# Patient Record
Sex: Male | Born: 1975 | Race: Black or African American | Hispanic: No | Marital: Married | Smoking: Former smoker
Health system: Southern US, Community
[De-identification: ages and names within clinical notes are randomized; demographics above are authoritative.]

## PROBLEM LIST (undated history)

## (undated) DIAGNOSIS — I255 Ischemic cardiomyopathy: Secondary | ICD-10-CM

## (undated) DIAGNOSIS — F1410 Cocaine abuse, uncomplicated: Secondary | ICD-10-CM

## (undated) DIAGNOSIS — E119 Type 2 diabetes mellitus without complications: Secondary | ICD-10-CM

## (undated) DIAGNOSIS — F1290 Cannabis use, unspecified, uncomplicated: Secondary | ICD-10-CM

## (undated) DIAGNOSIS — I201 Angina pectoris with documented spasm: Secondary | ICD-10-CM

## (undated) DIAGNOSIS — I469 Cardiac arrest, cause unspecified: Secondary | ICD-10-CM

## (undated) DIAGNOSIS — I2510 Atherosclerotic heart disease of native coronary artery without angina pectoris: Secondary | ICD-10-CM

## (undated) DIAGNOSIS — Z720 Tobacco use: Secondary | ICD-10-CM

## (undated) HISTORY — DX: Ischemic cardiomyopathy: I25.5

## (undated) HISTORY — DX: Angina pectoris with documented spasm: I20.1

## (undated) HISTORY — DX: Cocaine abuse, uncomplicated: F14.10

## (undated) HISTORY — DX: Cannabis use, unspecified, uncomplicated: F12.90

## (undated) HISTORY — DX: Cardiac arrest, cause unspecified: I46.9

## (undated) HISTORY — DX: Atherosclerotic heart disease of native coronary artery without angina pectoris: I25.10

## (undated) HISTORY — DX: Tobacco use: Z72.0

---

## 2015-09-26 ENCOUNTER — Inpatient Hospital Stay (HOSPITAL_COMMUNITY): Payer: Self-pay

## 2015-09-26 ENCOUNTER — Ambulatory Visit (HOSPITAL_COMMUNITY): Admit: 2015-09-26 | Payer: Self-pay | Admitting: Cardiovascular Disease

## 2015-09-26 ENCOUNTER — Encounter (HOSPITAL_COMMUNITY): Payer: Self-pay | Admitting: Emergency Medicine

## 2015-09-26 ENCOUNTER — Encounter (HOSPITAL_COMMUNITY)
Admission: EM | Disposition: A | Discharge status: Home / Self Care | Payer: Self-pay | Source: Home / Self Care | Attending: Cardiovascular Disease

## 2015-09-26 ENCOUNTER — Inpatient Hospital Stay (HOSPITAL_COMMUNITY)
Admission: EM | Admit: 2015-09-26 | Discharge: 2015-09-29 | DRG: 246 | Disposition: A | Discharge status: Home / Self Care | Payer: Self-pay | Attending: Cardiovascular Disease | Admitting: Cardiovascular Disease

## 2015-09-26 ENCOUNTER — Emergency Department (HOSPITAL_COMMUNITY): Payer: Self-pay

## 2015-09-26 DIAGNOSIS — I2583 Coronary atherosclerosis due to lipid rich plaque: Secondary | ICD-10-CM | POA: Diagnosis present

## 2015-09-26 DIAGNOSIS — I255 Ischemic cardiomyopathy: Secondary | ICD-10-CM | POA: Diagnosis present

## 2015-09-26 DIAGNOSIS — I469 Cardiac arrest, cause unspecified: Secondary | ICD-10-CM | POA: Diagnosis present

## 2015-09-26 DIAGNOSIS — Z9861 Coronary angioplasty status: Secondary | ICD-10-CM

## 2015-09-26 DIAGNOSIS — Z833 Family history of diabetes mellitus: Secondary | ICD-10-CM

## 2015-09-26 DIAGNOSIS — I2510 Atherosclerotic heart disease of native coronary artery without angina pectoris: Secondary | ICD-10-CM

## 2015-09-26 DIAGNOSIS — Z8249 Family history of ischemic heart disease and other diseases of the circulatory system: Secondary | ICD-10-CM

## 2015-09-26 DIAGNOSIS — I462 Cardiac arrest due to underlying cardiac condition: Secondary | ICD-10-CM | POA: Diagnosis present

## 2015-09-26 DIAGNOSIS — J9601 Acute respiratory failure with hypoxia: Secondary | ICD-10-CM | POA: Diagnosis present

## 2015-09-26 DIAGNOSIS — I4901 Ventricular fibrillation: Secondary | ICD-10-CM | POA: Diagnosis present

## 2015-09-26 DIAGNOSIS — I2109 ST elevation (STEMI) myocardial infarction involving other coronary artery of anterior wall: Secondary | ICD-10-CM

## 2015-09-26 DIAGNOSIS — I252 Old myocardial infarction: Secondary | ICD-10-CM

## 2015-09-26 DIAGNOSIS — I472 Ventricular tachycardia, unspecified: Secondary | ICD-10-CM | POA: Insufficient documentation

## 2015-09-26 DIAGNOSIS — J9600 Acute respiratory failure, unspecified whether with hypoxia or hypercapnia: Secondary | ICD-10-CM | POA: Diagnosis present

## 2015-09-26 DIAGNOSIS — N189 Chronic kidney disease, unspecified: Secondary | ICD-10-CM | POA: Diagnosis present

## 2015-09-26 DIAGNOSIS — I213 ST elevation (STEMI) myocardial infarction of unspecified site: Principal | ICD-10-CM | POA: Diagnosis present

## 2015-09-26 DIAGNOSIS — E1122 Type 2 diabetes mellitus with diabetic chronic kidney disease: Secondary | ICD-10-CM | POA: Diagnosis present

## 2015-09-26 DIAGNOSIS — F17210 Nicotine dependence, cigarettes, uncomplicated: Secondary | ICD-10-CM | POA: Diagnosis present

## 2015-09-26 DIAGNOSIS — Z978 Presence of other specified devices: Secondary | ICD-10-CM

## 2015-09-26 DIAGNOSIS — I2102 ST elevation (STEMI) myocardial infarction involving left anterior descending coronary artery: Secondary | ICD-10-CM

## 2015-09-26 DIAGNOSIS — Z955 Presence of coronary angioplasty implant and graft: Secondary | ICD-10-CM

## 2015-09-26 DIAGNOSIS — E785 Hyperlipidemia, unspecified: Secondary | ICD-10-CM | POA: Diagnosis present

## 2015-09-26 HISTORY — DX: Type 2 diabetes mellitus without complications: E11.9

## 2015-09-26 HISTORY — PX: CARDIAC CATHETERIZATION: SHX172

## 2015-09-26 LAB — BLOOD GAS, ARTERIAL
ACID-BASE DEFICIT: 7.4 mmol/L — AB (ref 0.0–2.0)
BICARBONATE: 18.1 meq/L — AB (ref 20.0–24.0)
Drawn by: 312971
FIO2: 1
LHR: 14 {breaths}/min
O2 Saturation: 99.3 %
PEEP/CPAP: 5 cmH2O
Patient temperature: 98.6
TCO2: 19.3 mmol/L (ref 0–100)
VT: 600 mL
pCO2 arterial: 39.5 mmHg (ref 35.0–45.0)
pH, Arterial: 7.283 — ABNORMAL LOW (ref 7.350–7.450)
pO2, Arterial: 286 mmHg — ABNORMAL HIGH (ref 80.0–100.0)

## 2015-09-26 LAB — RAPID URINE DRUG SCREEN, HOSP PERFORMED
Amphetamines: NOT DETECTED
Barbiturates: NOT DETECTED
Benzodiazepines: POSITIVE — AB
COCAINE: NOT DETECTED
OPIATES: NOT DETECTED
TETRAHYDROCANNABINOL: POSITIVE — AB

## 2015-09-26 LAB — BASIC METABOLIC PANEL
ANION GAP: 9 (ref 5–15)
BUN: 18 mg/dL (ref 6–20)
CALCIUM: 9.8 mg/dL (ref 8.9–10.3)
CHLORIDE: 104 mmol/L (ref 101–111)
CO2: 22 mmol/L (ref 22–32)
Creatinine, Ser: 1.15 mg/dL (ref 0.61–1.24)
GFR calc non Af Amer: >60 mL/min (ref >60)
Glucose, Bld: 146 mg/dL — ABNORMAL HIGH (ref 65–99)
Potassium: 4.1 mmol/L (ref 3.5–5.1)
Sodium: 135 mmol/L (ref 135–145)

## 2015-09-26 LAB — I-STAT TROPONIN, ED: TROPONIN I, POC: 0.1 ng/mL — AB (ref 0.00–0.08)

## 2015-09-26 LAB — CBG MONITORING, ED: Glucose-Capillary: 129 mg/dL — ABNORMAL HIGH (ref 65–99)

## 2015-09-26 LAB — CBC
HCT: 44.3 % (ref 39.0–52.0)
HEMOGLOBIN: 14.6 g/dL (ref 13.0–17.0)
MCH: 28.3 pg (ref 26.0–34.0)
MCHC: 33 g/dL (ref 30.0–36.0)
MCV: 85.9 fL (ref 78.0–100.0)
Platelets: 325 10*3/uL (ref 150–400)
RBC: 5.16 MIL/uL (ref 4.22–5.81)
RDW: 13.5 % (ref 11.5–15.5)
WBC: 10.1 10*3/uL (ref 4.0–10.5)

## 2015-09-26 LAB — ECHOCARDIOGRAM COMPLETE
Height: 71 in
Weight: 3365.1 oz

## 2015-09-26 LAB — TROPONIN I
Troponin I: 0.92 ng/mL (ref <0.031)
Troponin I: 0.88 ng/mL (ref <0.031)

## 2015-09-26 SURGERY — LEFT HEART CATH AND CORONARY ANGIOGRAPHY

## 2015-09-26 MED ORDER — SODIUM CHLORIDE 0.9 % IV SOLN
1.7500 mg/kg/h | Freq: Once | INTRAVENOUS | Status: AC
  Administered 2015-09-26: 1.75 mg/kg/h via INTRAVENOUS
  Filled 2015-09-26: qty 250

## 2015-09-26 MED ORDER — AMIODARONE HCL IN DEXTROSE 360-4.14 MG/200ML-% IV SOLN
INTRAVENOUS | Status: AC
  Filled 2015-09-26: qty 200

## 2015-09-26 MED ORDER — MIDAZOLAM HCL 2 MG/2ML IJ SOLN: 2.0000 mg | INTRAMUSCULAR | Status: DC | PRN

## 2015-09-26 MED ORDER — ATROPINE SULFATE 1 MG/10ML IJ SOSY
PREFILLED_SYRINGE | INTRAMUSCULAR | Status: AC
  Filled 2015-09-26: qty 10

## 2015-09-26 MED ORDER — FENTANYL CITRATE (PF) 2500 MCG/50ML IJ SOLN
10.0000 ug/h | INTRAMUSCULAR | Status: DC
  Administered 2015-09-26 (×2): 50 ug/h via INTRAVENOUS
  Filled 2015-09-26: qty 50

## 2015-09-26 MED ORDER — IOPAMIDOL (ISOVUE-370) INJECTION 76%
INTRAVENOUS | Status: DC | PRN
  Administered 2015-09-26: 175 mL via INTRA_ARTERIAL

## 2015-09-26 MED ORDER — DEXMEDETOMIDINE HCL IN NACL 200 MCG/50ML IV SOLN
0.4000 ug/kg/h | INTRAVENOUS | Status: AC
  Administered 2015-09-26: 0.4 ug/kg/h via INTRAVENOUS
  Filled 2015-09-26 (×2): qty 50

## 2015-09-26 MED ORDER — ASPIRIN EC 81 MG PO TBEC: 81.0000 mg | DELAYED_RELEASE_TABLET | Freq: Every day | ORAL | Status: DC

## 2015-09-26 MED ORDER — SODIUM CHLORIDE 0.9 % IV SOLN
1.0000 mg/h | INTRAVENOUS | Status: DC
  Filled 2015-09-26: qty 10

## 2015-09-26 MED ORDER — HEPARIN (PORCINE) IN NACL 2-0.9 UNIT/ML-% IJ SOLN
INTRAMUSCULAR | Status: DC | PRN
  Administered 2015-09-26: 1000 mL

## 2015-09-26 MED ORDER — DEXMEDETOMIDINE HCL IN NACL 400 MCG/100ML IV SOLN
0.4000 ug/kg/h | INTRAVENOUS | Status: DC
  Administered 2015-09-26 – 2015-09-27 (×2): 0.4 ug/kg/h via INTRAVENOUS
  Filled 2015-09-26 (×3): qty 100

## 2015-09-26 MED ORDER — ADENOSINE 12 MG/4ML IV SOLN
16.0000 mL | Freq: Once | INTRAVENOUS | Status: AC
  Administered 2015-09-26: 48 mg via INTRAVENOUS
  Filled 2015-09-26: qty 16

## 2015-09-26 MED ORDER — TICAGRELOR 90 MG PO TABS
ORAL_TABLET | ORAL | Status: AC
  Filled 2015-09-26: qty 1

## 2015-09-26 MED ORDER — BIVALIRUDIN BOLUS VIA INFUSION - CUPID
INTRAVENOUS | Status: DC | PRN
  Administered 2015-09-26: 72 mg via INTRAVENOUS

## 2015-09-26 MED ORDER — LIDOCAINE HCL (CARDIAC) 20 MG/ML IV SOLN
INTRAVENOUS | Status: AC | PRN
  Administered 2015-09-26: 100 mg via INTRAVENOUS

## 2015-09-26 MED ORDER — CLOPIDOGREL BISULFATE 75 MG PO TABS: 75.0000 mg | ORAL_TABLET | Freq: Every day | ORAL | Status: DC

## 2015-09-26 MED ORDER — BIVALIRUDIN 250 MG IV SOLR
INTRAVENOUS | Status: AC
  Filled 2015-09-26: qty 250

## 2015-09-26 MED ORDER — NITROGLYCERIN IN D5W 200-5 MCG/ML-% IV SOLN
INTRAVENOUS | Status: AC
  Filled 2015-09-26: qty 250

## 2015-09-26 MED ORDER — HEPARIN (PORCINE) IN NACL 2-0.9 UNIT/ML-% IJ SOLN
INTRAMUSCULAR | Status: AC
  Filled 2015-09-26: qty 500

## 2015-09-26 MED ORDER — SODIUM CHLORIDE 0.9 % IV SOLN: 250.0000 mL | INTRAVENOUS | Status: DC | PRN

## 2015-09-26 MED ORDER — SODIUM CHLORIDE 0.9 % IV SOLN
INTRAVENOUS | Status: AC
  Administered 2015-09-26: 75 mL/h via INTRAVENOUS

## 2015-09-26 MED ORDER — SODIUM CHLORIDE 0.9 % IV SOLN
1.0000 mg/h | INTRAVENOUS | Status: DC
  Administered 2015-09-26: 1 mg/h via INTRAVENOUS
  Administered 2015-09-26: 2 mg/h via INTRAVENOUS
  Administered 2015-09-26: 1 mg/h via INTRAVENOUS
  Filled 2015-09-26: qty 10

## 2015-09-26 MED ORDER — ONDANSETRON HCL 4 MG/2ML IJ SOLN: 4.0000 mg | Freq: Four times a day (QID) | INTRAMUSCULAR | Status: DC | PRN

## 2015-09-26 MED ORDER — ATORVASTATIN CALCIUM 80 MG PO TABS
80.0000 mg | ORAL_TABLET | Freq: Every day | ORAL | Status: DC
  Administered 2015-09-26 – 2015-09-28 (×3): 80 mg via ORAL
  Filled 2015-09-26 (×3): qty 1

## 2015-09-26 MED ORDER — SODIUM CHLORIDE 0.9% FLUSH: 3.0000 mL | INTRAVENOUS | Status: DC | PRN

## 2015-09-26 MED ORDER — IOPAMIDOL (ISOVUE-370) INJECTION 76%
INTRAVENOUS | Status: AC
  Filled 2015-09-26: qty 50

## 2015-09-26 MED ORDER — AMLODIPINE BESYLATE 5 MG PO TABS
5.0000 mg | ORAL_TABLET | Freq: Every day | ORAL | Status: DC
  Administered 2015-09-26 – 2015-09-29 (×4): 5 mg via ORAL
  Filled 2015-09-26 (×4): qty 1

## 2015-09-26 MED ORDER — FENTANYL CITRATE (PF) 100 MCG/2ML IJ SOLN: 50.0000 ug | Freq: Once | INTRAMUSCULAR | Status: DC

## 2015-09-26 MED ORDER — BIVALIRUDIN 250 MG IV SOLR
250.0000 mg | INTRAVENOUS | Status: DC | PRN
  Administered 2015-09-26 (×2): 1.75 mg/kg/h via INTRAVENOUS

## 2015-09-26 MED ORDER — PANTOPRAZOLE SODIUM 40 MG IV SOLR
40.0000 mg | Freq: Two times a day (BID) | INTRAVENOUS | Status: DC
  Administered 2015-09-26 (×2): 40 mg via INTRAVENOUS
  Filled 2015-09-26 (×2): qty 40

## 2015-09-26 MED ORDER — ACETAMINOPHEN 325 MG PO TABS
650.0000 mg | ORAL_TABLET | ORAL | Status: DC | PRN
  Administered 2015-09-27: 650 mg via ORAL
  Filled 2015-09-26: qty 2

## 2015-09-26 MED ORDER — ISOSORBIDE MONONITRATE ER 30 MG PO TB24
30.0000 mg | ORAL_TABLET | Freq: Every day | ORAL | Status: DC
  Administered 2015-09-26 – 2015-09-29 (×4): 30 mg via ORAL
  Filled 2015-09-26 (×4): qty 1

## 2015-09-26 MED ORDER — MORPHINE SULFATE (PF) 2 MG/ML IV SOLN: 2.0000 mg | INTRAVENOUS | Status: DC | PRN

## 2015-09-26 MED ORDER — ATORVASTATIN CALCIUM 80 MG PO TABS: 80.0000 mg | ORAL_TABLET | Freq: Every day | ORAL | Status: DC

## 2015-09-26 MED ORDER — NITROGLYCERIN 0.4 MG SL SUBL: 0.4000 mg | SUBLINGUAL_TABLET | SUBLINGUAL | Status: DC | PRN

## 2015-09-26 MED ORDER — ROCURONIUM BROMIDE 50 MG/5ML IV SOLN
INTRAVENOUS | Status: AC | PRN
  Administered 2015-09-26: 100 mg via INTRAVENOUS

## 2015-09-26 MED ORDER — HYDRALAZINE HCL 20 MG/ML IJ SOLN: 10.0000 mg | INTRAMUSCULAR | Status: DC | PRN

## 2015-09-26 MED ORDER — TICAGRELOR 90 MG PO TABS
ORAL_TABLET | ORAL | Status: DC | PRN
  Administered 2015-09-26: 180 mg via NASOGASTRIC

## 2015-09-26 MED ORDER — LIDOCAINE HCL (PF) 1 % IJ SOLN
INTRAMUSCULAR | Status: AC
  Filled 2015-09-26: qty 30

## 2015-09-26 MED ORDER — FENTANYL BOLUS VIA INFUSION
50.0000 ug | INTRAVENOUS | Status: DC | PRN
  Filled 2015-09-26: qty 50

## 2015-09-26 MED ORDER — SODIUM CHLORIDE 0.9% FLUSH
3.0000 mL | Freq: Two times a day (BID) | INTRAVENOUS | Status: DC
  Administered 2015-09-27 – 2015-09-29 (×5): 3 mL via INTRAVENOUS

## 2015-09-26 MED ORDER — ASPIRIN 81 MG PO CHEW
81.0000 mg | CHEWABLE_TABLET | Freq: Every day | ORAL | Status: DC
  Administered 2015-09-26 – 2015-09-29 (×4): 81 mg via ORAL
  Filled 2015-09-26 (×4): qty 1

## 2015-09-26 MED ORDER — NITROGLYCERIN 1 MG/10 ML FOR IR/CATH LAB
INTRA_ARTERIAL | Status: DC | PRN
  Administered 2015-09-26: 200 ug via INTRACORONARY

## 2015-09-26 MED ORDER — SODIUM CHLORIDE 0.9 % IV SOLN
INTRAVENOUS | Status: DC | PRN
  Administered 2015-09-26: 10:00:00
  Administered 2015-09-26: 20 mL/h via INTRAVENOUS
  Administered 2015-09-26: 100 mL/h

## 2015-09-26 MED ORDER — AMIODARONE HCL IN DEXTROSE 360-4.14 MG/200ML-% IV SOLN
60.0000 mg/h | INTRAVENOUS | Status: AC
  Administered 2015-09-26: 60 mg/h via INTRAVENOUS

## 2015-09-26 MED ORDER — TICAGRELOR 90 MG PO TABS
90.0000 mg | ORAL_TABLET | Freq: Two times a day (BID) | ORAL | Status: DC
  Administered 2015-09-26 – 2015-09-29 (×6): 90 mg via ORAL
  Filled 2015-09-26 (×6): qty 1

## 2015-09-26 MED ORDER — AMIODARONE HCL 150 MG/3ML IV SOLN
150.0000 mg | INTRAVENOUS | Status: AC | PRN
  Administered 2015-09-26 (×2): 150 mg via INTRAVENOUS

## 2015-09-26 MED ORDER — ETOMIDATE 2 MG/ML IV SOLN
INTRAVENOUS | Status: AC | PRN
  Administered 2015-09-26: 30 mg via INTRAVENOUS

## 2015-09-26 MED ORDER — IOPAMIDOL (ISOVUE-370) INJECTION 76%
INTRAVENOUS | Status: AC
  Filled 2015-09-26: qty 100

## 2015-09-26 MED ORDER — SODIUM CHLORIDE 0.9 % IV SOLN
10.0000 ug/h | INTRAVENOUS | Status: DC
  Filled 2015-09-26: qty 50

## 2015-09-26 MED ORDER — AMIODARONE HCL IN DEXTROSE 360-4.14 MG/200ML-% IV SOLN
30.0000 mg/h | INTRAVENOUS | Status: DC
  Administered 2015-09-26 – 2015-09-27 (×2): 30 mg/h via INTRAVENOUS
  Filled 2015-09-26 (×2): qty 200

## 2015-09-26 MED ORDER — FENTANYL CITRATE (PF) 2500 MCG/50ML IJ SOLN
25.0000 ug/h | INTRAMUSCULAR | Status: DC
  Administered 2015-09-26: 150 ug/h via INTRAVENOUS

## 2015-09-26 MED ORDER — NITROGLYCERIN 1 MG/10 ML FOR IR/CATH LAB
INTRA_ARTERIAL | Status: AC
  Filled 2015-09-26: qty 10

## 2015-09-26 MED FILL — Medication: Qty: 1 | Status: AC

## 2015-09-26 SURGICAL SUPPLY — 17 items
BALLN ~~LOC~~ EUPHORA RX 3.5X15 (BALLOONS) ×3
BALLOON ~~LOC~~ EUPHORA RX 3.5X15 (BALLOONS) ×1 IMPLANT
CATH INFINITI 5 FR JL3.5 (CATHETERS) ×3 IMPLANT
CATH INFINITI 5FR MULTPACK ANG (CATHETERS) ×3 IMPLANT
CATH MICROCATH NAVVUS (MICROCATHETER) ×1 IMPLANT
CATH VISTA GUIDE 6FR XBLAD3.5 (CATHETERS) ×3 IMPLANT
KIT ENCORE 26 ADVANTAGE (KITS) ×3 IMPLANT
KIT HEART LEFT (KITS) ×3 IMPLANT
MICROCATHETER NAVVUS (MICROCATHETER) ×3
PACK CARDIAC CATHETERIZATION (CUSTOM PROCEDURE TRAY) ×3 IMPLANT
SHEATH PINNACLE 6F 10CM (SHEATH) ×3 IMPLANT
STENT XIENCE ALPINE RX 3.25X18 (Permanent Stent) ×3 IMPLANT
SYR MEDRAD MARK V 150ML (SYRINGE) ×3 IMPLANT
TRANSDUCER W/STOPCOCK (MISCELLANEOUS) ×3 IMPLANT
TUBING CIL FLEX 10 FLL-RA (TUBING) ×3 IMPLANT
WIRE ASAHI PROWATER 180CM (WIRE) ×6 IMPLANT
WIRE EMERALD 3MM-J .035X150CM (WIRE) ×3 IMPLANT

## 2015-09-26 NOTE — ED Notes (Signed)
Patient transported to cath lab

## 2015-09-26 NOTE — ED Provider Notes (Signed)
CSN: [id number]     Arrival date & time 09/26/15  4098 History   First MD Initiated Contact with Patient 09/26/15 0805     Chief Complaint  Patient presents with  . Cardiac Arrest     Patient is a 40 y.o. male presenting with chest pain. The history is provided by the patient.  Chest Pain Pain location:  L chest Associated symptoms: shortness of breath   Associated symptoms: no abdominal pain, no back pain, no headache, no nausea, no numbness, not vomiting and no weakness   Patient presents with dull chest pain. She's had episodes over the last couple minutes. States he had one last night and took nitroglycerin. States improved. States he had another one today at work and took nitroglycerin and felt lightheaded. Had some nausea also with the nitroglycerin. No fevers. States he was seen at his local hospital, [facility] and then was transferred to [facility] for a heart catheterization. States that was done earlier this week. States they went in and then injected some nitroglycerin. States he did not get a stent. He has been having these episodes of chest pain for a while now. States he also had been using some cocaine but the chest pain episodes predated the cocaine use. Last used last week Thursday. No fevers or chills. He had had a little bit of a cold a couple weeks ago also.  Past Medical History  Diagnosis Date  . Diabetes mellitus without complication (HCC)   . MI (myocardial infarction) (HCC)    History reviewed. No pertinent past surgical history. Family History  Problem Relation Age of Onset  . Diabetes Mother   . Hypertension Mother    Social History  Substance Use Topics  . Smoking status: Current Every Day Smoker -- 1.00 packs/day for 20 years    Types: Cigarettes    Last Attempt to Quit: 09/22/2015  . Smokeless tobacco: None  . Alcohol Use: 4.2 oz/week    7 Cans of beer per week    Review of Systems  Constitutional: Negative for activity change and appetite change.   Eyes: Negative for pain.  Respiratory: Positive for shortness of breath. Negative for chest tightness.   Cardiovascular: Positive for chest pain. Negative for leg swelling.  Gastrointestinal: Negative for nausea, vomiting, abdominal pain and diarrhea.  Genitourinary: Negative for flank pain.  Musculoskeletal: Negative for back pain and neck stiffness.  Skin: Negative for rash.  Neurological: Negative for weakness, numbness and headaches.  Psychiatric/Behavioral: Negative for behavioral problems.      Allergies  Review of patient's allergies indicates no known allergies.  Home Medications   Prior to Admission medications   Medication Sig Start Date End Date Taking? Authorizing Provider  amLODipine (NORVASC) 5 MG tablet Take 5 mg by mouth daily.   Yes Historical Provider, MD  aspirin EC 81 MG tablet Take 81 mg by mouth daily.   Yes Historical Provider, MD  atorvastatin (LIPITOR) 80 MG tablet Take 80 mg by mouth daily.   Yes Historical Provider, MD  clopidogrel (PLAVIX) 75 MG tablet Take 75 mg by mouth daily.   Yes Historical Provider, MD  isosorbide mononitrate (IMDUR) 30 MG 24 hr tablet Take 30 mg by mouth daily.   Yes Historical Provider, MD  metFORMIN (GLUCOPHAGE) 500 MG tablet Take 500 mg by mouth 2 (two) times daily with a meal.   Yes Historical Provider, MD  nicotine (NICODERM CQ - DOSED IN MG/24 HOURS) 14 mg/24hr patch Place 14 mg onto the skin  daily.   Yes Historical Provider, MD  nitroGLYCERIN (NITROSTAT) 0.4 MG SL tablet Place 0.4 mg under the tongue every 5 (five) minutes as needed for chest pain.   Yes Historical Provider, MD   BP 118/77 mmHg  Pulse 81  Temp(Src) 98.3 F (36.8 C) (Oral)  Resp 22  Ht  (1.803 m)  Wt 210 lb 5.1 oz (95.4 kg)  BMI 29.35 kg/m2  SpO2 94% Physical Exam  Constitutional: He is oriented to person, place, and time. He appears well-developed and well-nourished.  HENT:  Head: Normocephalic and atraumatic.  Eyes: Pupils are equal, round,  and reactive to light.  Cardiovascular: Normal rate, regular rhythm and normal heart sounds.   No murmur heard. Pulmonary/Chest: Effort normal and breath sounds normal.  Abdominal: Soft. Bowel sounds are normal. He exhibits no distension.  Musculoskeletal: Normal range of motion. He exhibits no edema.  Neurological: He is alert and oriented to person, place, and time.  Skin: Skin is warm and dry.  Psychiatric: He has a normal mood and affect.  Nursing note and vitals reviewed.   ED Course  Procedures (including critical care time) Labs Review Labs Reviewed  BASIC METABOLIC PANEL - Abnormal; Notable for the following:    Glucose, Bld 146 (*)    All other components within normal limits  URINE RAPID DRUG SCREEN, HOSP PERFORMED - Abnormal; Notable for the following:    Benzodiazepines POSITIVE (*)    Tetrahydrocannabinol POSITIVE (*)    All other components within normal limits  BLOOD GAS, ARTERIAL - Abnormal; Notable for the following:    pH, Arterial 7.283 (*)    pO2, Arterial 286 (*)    Bicarbonate 18.1 (*)    Acid-base deficit 7.4 (*)    All other components within normal limits  I-STAT TROPOININ, ED - Abnormal; Notable for the following:    Troponin i, poc 0.10 (*)    All other components within normal limits  CBG MONITORING, ED - Abnormal; Notable for the following:    Glucose-Capillary 129 (*)    All other components within normal limits  CBC  TROPONIN I  TROPONIN I  TROPONIN I    Imaging Review Dg Chest 2 View  09/26/2015  CLINICAL DATA:  40 year old male with chest pain and recent myocardial infarction last week EXAM: CHEST  2 VIEW COMPARISON:  None. FINDINGS: The lungs are clear and negative for focal airspace consolidation, pulmonary edema or suspicious pulmonary nodule. No pleural effusion or pneumothorax. Cardiac and mediastinal contours are within normal limits. No acute fracture or lytic or blastic osseous lesions. The visualized upper abdominal bowel gas  pattern is unremarkable. IMPRESSION: Negative chest x-ray Electronically Signed   By: Malachy Moan M.D.   On: 09/26/2015 08:25   I have personally reviewed and evaluated these images and lab results as part of my medical decision-making.   EKG Interpretation   Date/Time:  Thursday Sep 26 2015 07:57:33 EDT Ventricular Rate:  71 PR Interval:  164 QRS Duration: 87 QT Interval:  387 QTC Calculation: 420 R Axis:   53 Text Interpretation:  Sinus rhythm Consider left ventricular hypertrophy  Nonspecific T abnormalities, inferior leads Confirmed by Rubin Payor  MD,  Tameisha Covell [phone]) on 09/26/2015 8:06:21 AM          MDM   Final diagnoses:  Ventricular fibrillation (HCC)  Ventricular tachycardia (HCC)  Cardiac arrest [facility])    Patient resented with chest pain. Had reportedly been seen at [facility] earlier in the week and had a  heart catheterization. Patient states that they did not stent but he was given nitroglycerin in the calf. States he was told something about there being 30%. He states he is on blood thinners but unknown which one. Presented with chest pain. Denies current cocaine use but states he used about a week ago. While in the ER he had a ventricular tachycardia arrest. Cardioverted back to a sinus rhythm with CPR. Troponin was mildly elevated 0.1. Cardiology notified. Patient then later had a V. tach progressing to V. fib arrest. Somewhat refractory to shocks. Given amiodarone magnesium lidocaine and eventually returned to a sinus rhythm. Intubated by myself. Taken emergently to cath lab.  INTUBATION Performed by: Billee CashingPICKERING,Braeleigh Pyper R.  Required items: required blood products, implants, devices, and special equipment available Patient identity confirmed: provided demographic data and hospital-assigned identification number Time out: Immediately prior to procedure a "time out" was called to verify the correct patient, procedure, equipment, support staff and site/side marked as  required.  Indications: cpr  Intubation method:macintosh  Laryngoscopy   Preoxygenation: BVM  Sedatives: Etomidate Paralytic: roccuronium  Tube Size: 7.5 cuffed  Post-procedure assessment: chest rise and ETCO2 monitor Breath sounds: equal and absent over the epigastrium Tube secured with: ETT holder  Patient tolerated the procedure well with no immediate complications.   CRITICAL CARE Performed by: Billee CashingPICKERING,Doyle Tegethoff R. Total critical care time: 30 minutes Critical care time was exclusive of separately billable procedures and treating other patients. Critical care was necessary to treat or prevent imminent or life-threatening deterioration. Critical care was time spent personally by me on the following activities: development of treatment plan with patient and/or surrogate as well as nursing, discussions with consultants, evaluation of patient's response to treatment, examination of patient, obtaining history from patient or surrogate, ordering and performing treatments and interventions, ordering and review of laboratory studies, ordering and review of radiographic studies, pulse oximetry and re-evaluation of patient's condition.     Benjiman CoreNathan Jnyah Brazee, MD 09/26/15 [phone]

## 2015-09-26 NOTE — Progress Notes (Signed)
Pt transferred from cath lab [facility] w/ no apparent complications.  Pt placed on vent settings 658ml/kg VT until MD can round on pt and place vent orders.  Unit RT aware.

## 2015-09-26 NOTE — Progress Notes (Signed)
Cardiac arrest resuscitation note:  Called to the ER with patient in refratory VT/VF - on arrival, patient was pulseless and agitated and monitor showed VF. He was immediately shocked, but remained in VF. He had received 300 mg IV amiodarone at this point. I asked for IV lidocaine and magnesium which was administered. Good quality CPR was being performed. The patient was diaphoretic and was dried and new pads were applied. Rhythm was confirmed to be VF and the patient was again shocked by myself at 200J and converted to junctional and eventual sinus rhythm. He was noted to have marked ST elevation. He was then intubated by Dr. Rubin PayorPickering and eventually blood pressure spontaneously improved. He was then taken directly to the cath lab by Dr. Allyson SabalBerry.  CRITICAL CARE:  The patient is critically ill with multi-organ system failure and requires high complexity decision making for assessment and support, frequent evaluation and titration of therapies, application of advanced monitoring technologies and extensive interpretation of multiple databases.  Time Spent Directly with the patient: 25 minutes  Chrystie NoseKenneth C. Vernel Donlan, MD, [facility] GilbertFACC Attending Cardiologist [facility]

## 2015-09-26 NOTE — ED Notes (Signed)
Pt admits to 1.5 beers daily, reports marijuana and cocaine use x 1 week ago.

## 2015-09-26 NOTE — Consult Note (Signed)
PULMONARY / CRITICAL CARE MEDICINE   Name: Nathaniel Phillips MRN: [id number] DOB: 1976-04-24    ADMISSION DATE:  09/26/2015 CONSULTATION DATE:  09/26/2015  REFERRING MD:  Dr. Allyson SabalBerry  CHIEF COMPLAINT: Cardiac Arrest  HISTORY OF PRESENT ILLNESS:   This is a 40 y.o. Male with a past medical history of Type 2 DM, substance abuse (Cocaine and Marijuana), consumes 2 beers a night during the week and 4 beers per night during the weekend, and current 1 ppd smoker for 20 years.  He arrived to the ED on 09/26/2015 with c/o chest pain.  He has been experiencing intermittent chest pain for the past few days and took a total of 2 SL NTG this am which helped relieve the chest pain.  He reported using Cocaine and Marijuana one week ago.  [facility] that showed moderate proximal LAD disease and says he was placed on ASA and Plavix at that time but did not require a stent. He went to work this am 09/26/2015 and became diaphoretic and had a syncopal episode EMS was called.  Upon arrival to the ED he was coherent and talking with the physician but then subsequently coded.  He was in refractory VT/VF and was immediately shocked, but remained in VF.  He received 300 mg IV Amiodarone along with IV lidocaine (100 mg) and Magnesium (2g).  CPR was administered and he was shocked with 200J and converted to junctional and eventual sinus rhythm.  His tracing showed diffuse anterior ST elevation and he was intubated while in the ED and taken to the cath lab for an emergent left heart catheterization and angiography a drug-eluting stent was  placed in the proximal LAD due to 70% stenosis. PCCM was consulted 09/26/2015 for vent management and post cardiac arrest care.  PAST MEDICAL HISTORY :  He  has a past medical history of Diabetes mellitus without complication (HCC) and MI (myocardial infarction) (HCC).  PAST SURGICAL HISTORY: He  has no past surgical history on file.  No Known  Allergies  No current facility-administered medications on file prior to encounter.   No current outpatient prescriptions on file prior to encounter.   FAMILY HISTORY:  His has no family status information on file.   SOCIAL HISTORY: He  reports that he has been smoking Cigarettes.  He has a 20 pack-year smoking history. He does not have any smokeless tobacco history on file. He reports that he drinks about 4.2 oz of alcohol per week. He reports that he uses illicit drugs (Marijuana and Cocaine).  REVIEW OF SYSTEMS:   Unable to assess pt intubated   SUBJECTIVE:  Pt sedated and intubated   VITAL SIGNS: BP 178/78 mmHg  Pulse 108  Temp(Src) 97.7 F (36.5 C) (Oral)  Resp 18  SpO2 100%  HEMODYNAMICS:    VENTILATOR SETTINGS: Vent Mode:  [-] PRVC FiO2 (%):  [100 %] 100 % Set Rate:  [14 bmp] 14 bmp Vt Set:  [580 mL] 580 mL PEEP:  [5 cmH20] 5 cmH20 Plateau Pressure:  [19 cmH20] 19 cmH20  INTAKE / OUTPUT:    PHYSICAL EXAMINATION: General:  Critically ill appearing male, intubated  Neuro: follows commands, pupuls 3 mm bilaterally HEENT:  Supple, no JVD Cardiovascular:  NSR, s1s2, RRR Lungs:  Coarse throughout all lobes, even non labored Abdomen:  Hypoactive X4, soft, non distended, non tender  Musculoskeletal:  Moves all extremities, normal bulk Skin:  Intact   LABS:  BMET  Recent  Labs Lab 09/26/15 0800  NA 135  K 4.1  CL 104  CO2 22  BUN 18  CREATININE 1.15  GLUCOSE 146*    Electrolytes  Recent Labs Lab 09/26/15 0800  CALCIUM 9.8    CBC  Recent Labs Lab 09/26/15 0800  WBC 10.1  HGB 14.6  HCT 44.3  PLT 325    Coag's No results for input(s): APTT, INR in the last 168 hours.  Sepsis Markers No results for input(s): LATICACIDVEN, PROCALCITON, O2SATVEN in the last 168 hours.  ABG No results for input(s): PHART, PCO2ART, PO2ART in the last 168 hours.  Liver Enzymes No results for input(s): AST, ALT, ALKPHOS, BILITOT, ALBUMIN in the last  168 hours.  Cardiac Enzymes No results for input(s): TROPONINI, PROBNP in the last 168 hours.  Glucose  Recent Labs Lab 09/26/15 0834  GLUCAP 129*    Imaging Dg Chest 2 View  09/26/2015  CLINICAL DATA:  40 year old male with chest pain and recent myocardial infarction last week EXAM: CHEST  2 VIEW COMPARISON:  None. FINDINGS: The lungs are clear and negative for focal airspace consolidation, pulmonary edema or suspicious pulmonary nodule. No pleural effusion or pneumothorax. Cardiac and mediastinal contours are within normal limits. No acute fracture or lytic or blastic osseous lesions. The visualized upper abdominal bowel gas pattern is unremarkable. IMPRESSION: Negative chest x-ray Electronically Signed   By: Malachy Moan M.D.   On: 09/26/2015 08:25     STUDIES:  Cardiac catheterization 5/25>>Ost LAD to Prox LAD lesion, 70% stenosed. Post intervention, there is a 0% residual stenosis Urine drug screen 5/25>> Echo 09/23/2015 (from [facility])>>EF 50% with mild LV dysfunction and mild LVH  CULTURES: None  ANTIBIOTICS: None  SIGNIFICANT EVENTS: 5/25>>Pt VT/VF cardiac arrested in ED shocked X2 and intubated   LINES/TUBES: 5/25 ETT>> 5/25 R femoral aline>>  DISCUSSION: This is a 40 y.o. male arrived to the ED on 09/26/2015 with c/o chest pain.He went to work this am 09/26/2015 and became diaphoretic and had a syncopal episode EMS was called.  Upon arrival to the ED he was coherent and talking with the physician but then subsequently coded.  He was in refractory VT/VF and was immediately shocked, but remained in VF. He received 300 mg IV Amiodarone along with IV lidocaine (100 mg) and Magnesium (2g).  CPR was administered and he was shocked with 200J and converted to junctional and eventual sinus rhythm.  His tracing showed diffuse ST elevation and he was intubated while in the ED and taken to the cath lab for an emergent cardiac catheterization.   ASSESSMENT /  PLAN:  PULMONARY A: Acute hypoxic respiratory failure secondary to Cardiac arrest P:   No hypothermia protocol pt following commands post arrest  Wean to extubate today. Obtain ABG and adjust vent. Vent bundle   CARDIOVASCULAR A:  VF/VT Cardiac arrest/Chest pain-STEMI Hx: HTN, HLD P:  Cycle troponins Tele Maintain map >65 Cardiology consulted appreciate input Per Cards continue amiodarone drip and angiomax drip Per cards pt will need to be on dual antiplatelet therapy for at least 12 months Prn Hydralazine for HTN  RENAL A:   No problems identified P:  Continue maintenance IV fluids   Trend BMP's Replace electrolytes as indicated Monitor uop   GASTROINTESTINAL A:   No problems identified P:   PPI for SUP Keep NPO for now  HEMATOLOGIC A:   No problems identified  P:  Heparin drip  SCD's for VTE prophylaxis  Monitor for s/sx of bleeding  Transfuse for HgB <7  ENDOCRINE A: Hx: Type 2 Diabetes Mellitus P: Hold metformin 48 hrs post cardiac cath ICU Hyperglycemic protocol as indicated   INFECTIOUS A:   No problems identified  P:   Trend WBC's and monitor fever curve  NEUROLOGIC A:   Hx: Substance abuse P:   RASS goal: -1 Continue fentanyl drip to maintain RASS goal Prn Versed and fentanyl to maintain RASS goal and pain management Monitor for s/sx of withdrawl  FAMILY  - Updates: Family updated bedside.  - Inter-disciplinary family meet or Palliative Care meeting due by:  day 7  Sonda Rumble, [place]  Pulmonary/Critical Care  Attending Note:  39 year old male with polysubstance abuse presenting to the hospital with CP.  Had a cardiac arrest in the ED and was resuscitated.  Taken to cath lab and stented.  Was mentally intact post arrest so not cooled.  Now awake and following commands.  On exam, lungs are clear.  Will extubate now.  The patient is critically ill with multiple organ systems failure and requires high complexity decision making  for assessment and support, frequent evaluation and titration of therapies, application of advanced monitoring technologies and extensive interpretation of multiple databases.   Critical Care Time devoted to patient care services described in this note is  35  Minutes. This time reflects time of care of this signee Dr Koren Bound. This critical care time does not reflect procedure time, or teaching time or supervisory time of PA/NP/Med student/Med Resident etc but could involve care discussion time.  Alyson Reedy, M.D. [facility] Pulmonary/Critical Care Medicine. Pager: [phone]. After hours pager: [phone].

## 2015-09-26 NOTE — Progress Notes (Signed)
Echocardiogram 2D Echocardiogram has been performed.  Nathaniel Phillips, Nathaniel Phillips 09/26/2015, 4:03 PM

## 2015-09-26 NOTE — Progress Notes (Signed)
Versed 20mL AND fentanyl 200mL wasted in sink by Lasheba Stevens ScOzella Rockshmitz RN and Edd FabianJesse Cleaver RN.

## 2015-09-26 NOTE — Code Documentation (Signed)
Cath lab ready for patient.

## 2015-09-26 NOTE — Procedures (Signed)
Extubation Procedure Note  Patient Details:   Name: Nathaniel Phillips DOB: 05/02/1976 MRN: [id number]   Airway Documentation:  Airway 7.5 mm (Active)  Secured at (cm) 22 cm 09/26/2015 11:08 AM  Measured From Lips 09/26/2015 11:08 AM  Secured Location Center 09/26/2015 11:08 AM  Secured By [employer] Tube Holder 09/26/2015 11:08 AM  Tube Holder Repositioned Yes 09/26/2015  9:13 AM  Site Condition Dry 09/26/2015 11:08 AM    Evaluation  O2 sats: stable throughout Complications: No apparent complications Patient did tolerate procedure well. Bilateral Breath Sounds: Rhonchi   Yes  Ok AnisKelly Smith, MA 09/26/2015, 1:14 PM

## 2015-09-26 NOTE — Progress Notes (Signed)
RT assisted in transport of patient on vent to cath lab. Vitals remained stable through out the trip.

## 2015-09-26 NOTE — Progress Notes (Signed)
RT moved ET tube back 2 cm per MD request.

## 2015-09-26 NOTE — Code Documentation (Signed)
ET tube pulled back 2cm

## 2015-09-26 NOTE — Consult Note (Signed)
Cardiology Consult    Patient ID: Nathaniel Duralnthony Caya MRN: [id number], DOB/AGE: 1975-12-09   Admit date: 09/26/2015 Date of Consult: 09/26/2015  Primary Physician: No primary care provider on file. Reason for Consult: Cardiac Arrest Primary Cardiologist: New Requesting Provider: Dr. Rubin PayorPickering  History of Present Illness    Nathaniel Phillips is a 40 y.o. male with past medical history of Type 2 DM and substance abuse (Cocaine and Marijuana) who [facility] for evaluation of chest pain.   Upon arrival to the room, he was in refratory VT/VF. He was immediately shocked, but remained in VF. He received 300 mg IV Amiodarone along with  IV lidocaine (100mg ) and Magnesium (2g). CPR was administered and he was shocked with 200J and converted to junctional and eventual sinus rhythm. His tracing showed diffuse ST elevation and he was intubated while in the ED and taken to the cath lab for emergent cardiac catheterization.  History is obtained by review of the chart and Care Everywhere. He reported having a catheterization last week and says he was placed on ASA and Plavix at that time but did not require a stent. He had been experiencing intermittent CP for the past few days and took 2 SL NTG this AM which helped with the pain. He then became diaphoretic and had a syncopal event at work and EMS was called. Upon arrival to the ED, he was coherent and talking with the Physician but then subsequently coded. He reported using Cocaine and Marijuana one week ago.  Review of Care Everywhere shows he had a cardiac catheterization on 09/23/2015 which showed "some non-angiographically significant plaque in the proximal LAD extending to a large first diagonal. The significance of the LAD lesion changes quite a bit after NTG, from a stenosis of 62% to 33-40% by QCA after NTG". He was started on ASA and Plavix at that time along with Amlodipine, Lipitor, and Imdur. His peak troponin that  admission was 1.86 and this was thought to be secondary to coronary vasospasm.   Records show he has a 20-pack year history, consumes 2-4 beers daily and has a history of marijuana and cocaine use. Family history significant for Type 2 DM and HTN in his mother.  Past Medical History   Past Medical History  Diagnosis Date  . Diabetes mellitus without complication (HCC)   . MI (myocardial infarction) (HCC)     History reviewed. No pertinent past surgical history.   Allergies  No Known Allergies  Inpatient Medications       Family History    Family History  Problem Relation Age of Onset  . Diabetes Mother   . Hypertension Mother     Social History    Social History   Social History  . Marital Status: Married    Spouse Name: N/A  . Number of Children: N/A  . Years of Education: N/A   Occupational History  . Not on file.   Social History Main Topics  . Smoking status: Current Every Day Smoker -- 1.00 packs/day for 20 years    Types: Cigarettes    Last Attempt to Quit: 09/22/2015  . Smokeless tobacco: Not on file  . Alcohol Use: 4.2 oz/week    7 Cans of beer per week  . Drug Use: Yes    Special: Marijuana, Cocaine     Comment: last cocaine use was 09/22/15  . Sexual Activity: Not on file   Other Topics Concern  . Not on file  Social History Narrative  . No narrative on file     Review of Systems    General:  No chills, fever, night sweats or weight changes.  Cardiovascular:  No dyspnea on exertion, edema, orthopnea, palpitations, paroxysmal nocturnal dyspnea. Positive for chest pain. Dermatological: No rash, lesions/masses Respiratory: No cough, dyspnea Urologic: No hematuria, dysuria Abdominal:   No nausea, vomiting, diarrhea, bright red blood per rectum, melena, or hematemesis Neurologic:  No visual changes, wkns, changes in mental status. Positive for syncope. All other systems reviewed and are otherwise negative except as noted above.  Physical  Exam    Blood pressure 135/91, pulse 113, temperature 97.7 F (36.5 C), temperature source Oral, resp. rate 18, SpO2 100 %.  General: African American male, currently intubated. Psych: Normal affect. Neuro: Unable to be assessed secondary to intubation. Moves all extremities spontaneously. HEENT: Normal  Neck: Supple without bruits or JVD. Lungs:  Resp regular and unlabored, CTA. Heart: RRR no s3, s4, or murmurs. Abdomen: Soft, non-tender, non-distended, BS + x 4.  Extremities: No clubbing, cyanosis or edema. DP/PT/Radials 2+ and equal bilaterally.  Labs    Troponin [facility] of Care Test)  Recent Labs  09/26/15 0811  TROPIPOC 0.10*   No results for input(s): CKTOTAL, CKMB, TROPONINI in the last 72 hours. Lab Results  Component Value Date   WBC 10.1 09/26/2015   HGB 14.6 09/26/2015   HCT 44.3 09/26/2015   MCV 85.9 09/26/2015   PLT 325 09/26/2015     Recent Labs Lab 09/26/15 0800  NA 135  K 4.1  CL 104  CO2 22  BUN 18  CREATININE 1.15  CALCIUM 9.8  GLUCOSE 146*   No results found for: CHOL, HDL, LDLCALC, TRIG No results found for: [facility]   Radiology Studies    Dg Chest 2 View: 09/26/2015  CLINICAL DATA:  40 year old male with chest pain and recent myocardial infarction last week EXAM: CHEST  2 VIEW COMPARISON:  None. FINDINGS: The lungs are clear and negative for focal airspace consolidation, pulmonary edema or suspicious pulmonary nodule. No pleural effusion or pneumothorax. Cardiac and mediastinal contours are within normal limits. No acute fracture or lytic or blastic osseous lesions. The visualized upper abdominal bowel gas pattern is unremarkable. IMPRESSION: Negative chest x-ray Electronically Signed   By: Malachy Moan M.D.   On: 09/26/2015 08:25    EKG & Cardiac Imaging    EKG: ST elevation in anterior leads, TWI in lateral leads.  Assessment & Plan    1. Cardiac Arrest/ Chest Pain - reported having episodes of intermittent chest pain for the past  few days. Had a cardiac cath at [facility] on 09/23/2015 which showed no significant CAD and his NSTEMI was thought to be secondary to vasospasm.  - while in the ED, he went into refractory VT/VF. Received 300 mg IV Amiodarone along with  IV Lidocaine ( ) and Magnesium (2g). CPR was administered and he was shocked with 200J and converted to junctional and eventual sinus rhythm. EKG showed diffuse ST elevation and he was intubated while in the ED and taken to the cath lab for emergent cardiac catheterization. - further recommendations pending cath results. - would cycle troponin values.  - CCM contacted for vent management and post-cardiac arrest care.  2. Substance Abuse - reports a history of marijuana, cocaine, and tobacco use. - cessation will need to be encouraged once he is medically stable.   3. Type 2 DM - Metformin will need to be held 48 hours following  cath. - SSI while admitted.  4. HLD - continue statin therapy.  Signed, Ellsworth Lennox, PA-C 09/26/2015, 9:48 AM Pager: [phone]

## 2015-09-26 NOTE — ED Notes (Signed)
Per EMS - pt had cold-like symptoms/cp/discomfort last week and [facility], Sunday night pt became diaphoretic and couldn't catch his breath - had cardiac cath. Pt just started on blood thinner and dx w/ diabetes. Pt went to work yesterday, last night began experiencing left-sided cp (intermittent, dull, stabbing) relieved by nitro. Pain resolved last night. Pt woke up this morning and noticed cp had returned. Pt took nitro that mildly relieved CP. Pt went to work and began having CP again, took nitro. Pt began feeling light-headed and had syncopal episode. Pt had some elevation in V1, V2, V3 w/ LVH and depression in other leads. Posterior leads showed depression.  324mg  aspirin.

## 2015-09-27 ENCOUNTER — Inpatient Hospital Stay (HOSPITAL_COMMUNITY): Payer: Self-pay

## 2015-09-27 ENCOUNTER — Other Ambulatory Visit (HOSPITAL_COMMUNITY): Payer: Self-pay

## 2015-09-27 DIAGNOSIS — J9600 Acute respiratory failure, unspecified whether with hypoxia or hypercapnia: Secondary | ICD-10-CM

## 2015-09-27 LAB — CBC WITH DIFFERENTIAL/PLATELET
Basophils Absolute: 0 K/uL (ref 0.0–0.1)
Basophils Relative: 0 %
Eosinophils Absolute: 0 K/uL (ref 0.0–0.7)
Eosinophils Relative: 0 %
HCT: 37.7 % — ABNORMAL LOW (ref 39.0–52.0)
Hemoglobin: 12.3 g/dL — ABNORMAL LOW (ref 13.0–17.0)
Lymphocytes Relative: 19 %
Lymphs Abs: 1.8 K/uL (ref 0.7–4.0)
MCH: 27.4 pg (ref 26.0–34.0)
MCHC: 32.6 g/dL (ref 30.0–36.0)
MCV: 84 fL (ref 78.0–100.0)
Monocytes Absolute: 0.5 K/uL (ref 0.1–1.0)
Monocytes Relative: 5 %
Neutro Abs: 7.1 K/uL (ref 1.7–7.7)
Neutrophils Relative %: 76 %
Platelets: 264 K/uL (ref 150–400)
RBC: 4.49 MIL/uL (ref 4.22–5.81)
RDW: 13.6 % (ref 11.5–15.5)
WBC: 9.4 K/uL (ref 4.0–10.5)

## 2015-09-27 LAB — BASIC METABOLIC PANEL
ANION GAP: 7 (ref 5–15)
BUN: 16 mg/dL (ref 6–20)
CHLORIDE: 107 mmol/L (ref 101–111)
CO2: 22 mmol/L (ref 22–32)
CREATININE: 1.14 mg/dL (ref 0.61–1.24)
Calcium: 8.8 mg/dL — ABNORMAL LOW (ref 8.9–10.3)
GFR calc non Af Amer: >60 mL/min (ref >60)
Glucose, Bld: 117 mg/dL — ABNORMAL HIGH (ref 65–99)
Potassium: 4.1 mmol/L (ref 3.5–5.1)
Sodium: 136 mmol/L (ref 135–145)

## 2015-09-27 LAB — GLUCOSE, CAPILLARY
GLUCOSE-CAPILLARY: 82 mg/dL (ref 65–99)
GLUCOSE-CAPILLARY: 110 mg/dL — AB (ref 65–99)
Glucose-Capillary: 135 mg/dL — ABNORMAL HIGH (ref 65–99)

## 2015-09-27 LAB — POCT ACTIVATED CLOTTING TIME: Activated Clotting Time: 327 s

## 2015-09-27 MED ORDER — INSULIN ASPART 100 UNIT/ML ~~LOC~~ SOLN: 0.0000 [IU] | Freq: Every day | SUBCUTANEOUS | Status: DC

## 2015-09-27 MED ORDER — PANTOPRAZOLE SODIUM 40 MG PO TBEC: 40.0000 mg | DELAYED_RELEASE_TABLET | Freq: Every day | ORAL | Status: DC

## 2015-09-27 MED ORDER — INSULIN ASPART 100 UNIT/ML ~~LOC~~ SOLN
0.0000 [IU] | Freq: Three times a day (TID) | SUBCUTANEOUS | Status: DC
  Administered 2015-09-27 – 2015-09-28 (×2): 1 [IU] via SUBCUTANEOUS
  Administered 2015-09-28: 2 [IU] via SUBCUTANEOUS
  Administered 2015-09-29: 1 [IU] via SUBCUTANEOUS

## 2015-09-27 MED ORDER — LISINOPRIL 2.5 MG PO TABS
2.5000 mg | ORAL_TABLET | Freq: Every day | ORAL | Status: DC
  Administered 2015-09-27 – 2015-09-29 (×3): 2.5 mg via ORAL
  Filled 2015-09-27 (×3): qty 1

## 2015-09-27 MED ORDER — HEPARIN SODIUM (PORCINE) 5000 UNIT/ML IJ SOLN
5000.0000 [IU] | Freq: Three times a day (TID) | INTRAMUSCULAR | Status: DC
  Administered 2015-09-27 – 2015-09-29 (×6): 5000 [IU] via SUBCUTANEOUS
  Filled 2015-09-27 (×6): qty 1

## 2015-09-27 MED ORDER — CARVEDILOL 3.125 MG PO TABS
3.1250 mg | ORAL_TABLET | Freq: Two times a day (BID) | ORAL | Status: DC
  Administered 2015-09-27 – 2015-09-29 (×5): 3.125 mg via ORAL
  Filled 2015-09-27 (×5): qty 1

## 2015-09-27 MED FILL — Heparin Sodium (Porcine) 2 Unit/ML in Sodium Chloride 0.9%: INTRAMUSCULAR | Qty: 500 | Status: AC

## 2015-09-27 NOTE — Consult Note (Signed)
PULMONARY / CRITICAL CARE MEDICINE   Name: Nathaniel Phillips MRN: [id number] DOB: Dec 21, 1975    ADMISSION DATE:  09/26/2015 CONSULTATION DATE:  09/26/2015  REFERRING MD:  Dr. Allyson Sabal  CHIEF COMPLAINT: Cardiac Arrest  HISTORY OF PRESENT ILLNESS:   This is a 40 y.o. Male with a past medical history of Type 2 DM, substance abuse (Cocaine and Marijuana), consumes 2 beers a night during the week and 4 beers per night during the weekend, and current 1 ppd smoker for 20 years.  He arrived to the ED on 09/26/2015 with c/o chest pain.  He has been experiencing intermittent chest pain for the past few days and took a total of 2 SL NTG this am which helped relieve the chest pain.  He reported using Cocaine and Marijuana one week ago.  He reported having a cardiac catheterization at [facility] last week that showed moderate proximal LAD disease and says he was placed on ASA and Plavix at that time but did not require a stent. He went to work this am 09/26/2015 and became diaphoretic and had a syncopal episode EMS was called.  Upon arrival to the ED he was coherent and talking with the physician but then subsequently coded.  He was in refractory VT/VF and was immediately shocked, but remained in VF.  He received 300 mg IV Amiodarone along with IV lidocaine (100 mg) and Magnesium (2g).  CPR was administered and he was shocked with 200J and converted to junctional and eventual sinus rhythm.  His tracing showed diffuse anterior ST elevation and he was intubated while in the ED and taken to the cath lab for an emergent left heart catheterization and angiography a drug-eluting stent was  placed in the proximal LAD due to 70% stenosis. PCCM was consulted 09/26/2015 for vent management and post cardiac arrest care.  SUBJECTIVE:  Awake, off vent , no distress  VITAL SIGNS: BP 103/55 mmHg  Pulse 60  Temp(Src) 98.5 F (36.9 C) (Oral)  Resp 23  Ht  (1.803 m)  Wt 95.4 kg (210 lb 5.1 oz)  BMI 29.35 kg/m2  SpO2  97%  HEMODYNAMICS:    VENTILATOR SETTINGS: Vent Mode:  [-] CPAP;PSV FiO2 (%):  [40 %-100 %] 40 % Set Rate:  [14 bmp] 14 bmp Vt Set:  [580 mL-600 mL] 600 mL PEEP:  [5 cmH20] 5 cmH20 Pressure Support:  [5 cmH20] 5 cmH20 Plateau Pressure:  [18 cmH20-19 cmH20] 18 cmH20  INTAKE / OUTPUT: I/O last 3 completed shifts: In: 1437 [P.O.:50; I.V.:1387] Out: 1735 [Urine:1735]  PHYSICAL EXAMINATION: General:  Critically ill appearing male Neuro: follows commands HEENT:  Supple, no JVD Cardiovascular:  s1s2, RRR Lungs:  CTA Abdomen:  Hypoactive X4, soft, non distended, non tender  Musculoskeletal:  Moves all extremities, normal bulk Skin:  Intact   LABS:  BMET  Recent Labs Lab 09/26/15 0800 09/27/15 0214  NA 135 136  K 4.1 4.1  CL 104 107  CO2 22 22  BUN 18 16  CREATININE 1.15 1.14  GLUCOSE 146* 117*    Electrolytes  Recent Labs Lab 09/26/15 0800 09/27/15 0214  CALCIUM 9.8 8.8*    CBC  Recent Labs Lab 09/26/15 0800 09/27/15 0214  WBC 10.1 9.4  HGB 14.6 12.3*  HCT 44.3 37.7*  PLT 325 264    Coag's No results for input(s): APTT, INR in the last 168 hours.  Sepsis Markers No results for input(s): LATICACIDVEN, PROCALCITON, O2SATVEN in the last 168 hours.  ABG  Recent Labs Lab  09/26/15 1203  PHART 7.283*  PCO2ART 39.5  PO2ART 286*    Liver Enzymes No results for input(s): AST, ALT, ALKPHOS, BILITOT, ALBUMIN in the last 168 hours.  Cardiac Enzymes  Recent Labs Lab 09/26/15 1634 09/26/15 2136  TROPONINI 0.92* 0.88*    Glucose  Recent Labs Lab 09/26/15 0834  GLUCAP 129*    Imaging Dg Chest Port 1 View  09/27/2015  CLINICAL DATA:  Acute respiratory failure EXAM: PORTABLE CHEST 1 VIEW COMPARISON:  09/26/2015 FINDINGS: Mild cardiomegaly. No confluent airspace opacities, effusions or edema. No acute bony abnormality. IMPRESSION: Borderline cardiomegaly. Electronically Signed   By: Charlett NoseKevin  Dover M.D.   On: 09/27/2015 07:02     STUDIES:   Cardiac catheterization 5/25>>Ost LAD to Prox LAD lesion, 70% stenosed. Post intervention, there is a 0% residual stenosis Urine drug screen 5/25>> Echo 09/23/2015 (from [facility])>>EF 50% with mild LV dysfunction and mild LVH  CULTURES: None  ANTIBIOTICS: None  SIGNIFICANT EVENTS: 5/25>>Pt VT/VF cardiac arrested in ED shocked X2 and intubated  5/25- extubated 1p m  LINES/TUBES: 5/25 ETT>>5/25 5/25 R femoral aline>>5/25  DISCUSSION: This is a 40 y.o. male arrived to the ED on 09/26/2015 with c/o chest pain.He went to work this am 09/26/2015 and became diaphoretic and had a syncopal episode EMS was called.  Upon arrival to the ED he was coherent and talking with the physician but then subsequently coded.  He was in refractory VT/VF and was immediately shocked, but remained in VF. He received 300 mg IV Amiodarone along with IV lidocaine (100 mg) and Magnesium (2g).  CPR was administered and he was shocked with 200J and converted to junctional and eventual sinus rhythm.  His tracing showed diffuse ST elevation and he was intubated while in the ED and taken to the cath lab for an emergent cardiac catheterization.   ASSESSMENT / PLAN:  PULMONARY A: Acute hypoxic respiratory failure secondary to Cardiac arrest P:   Extubated, remains no distress ra IS  CARDIOVASCULAR A:  VF/VT Cardiac arrest/Chest pain-STEMI Hx: HTN, HLD Cocaine use P:  Tele Maintain map on own okay Cardiology consulted appreciate input Per Cards , brilinta, asa, coreg Echo  Tele Avoid beta blockade alone, coreg does have alpha blockade  RENAL A:   At risk atn P:  lock  GASTROINTESTINAL A:   No problems identified P:   PPI for SUP - dc, not home med Keep NPO for now  HEMATOLOGIC A: dvt prevention P:  scd If not walky, add sub q hep  ENDOCRINE A: Hx: Type 2 Diabetes Mellitus - new dx P: Hold metformin 48 hrs post cardiac cath start ssi Obtain hgb a 1c   INFECTIOUS A:   No  problems identified  P:   monitor fever curve  NEUROLOGIC A:   Hx: Substance abuse P:   RASS goal: 0  FAMILY  - Updates: Family updated bedside.  - Inter-disciplinary family meet or Palliative Care meeting due by:  day 7  Will sign off, call as needed  Mcarthur RossettiDaniel J. Tyson AliasFeinstein, MD, FACP Pgr: [phone] [facility]

## 2015-09-27 NOTE — Progress Notes (Addendum)
CARDIAC REHAB PHASE I   PRE:  Rate/Rhythm: 68 SR    BP: sitting 127/52    SaO2:   MODE:  Ambulation: 550 ft   POST:  Rate/Rhythm: 76 SR    BP: sitting 135/65     SaO2:   Tolerated well. Sts he is sore. Ed completed with pt and family. Voiced understanding, very receptive. Plans to quit smoking. Has nicotine patch if he needs it. Plans to abstain from drugs. Pt had taken NTG standing up, walking, driving. Discussed this. Will send referral to [facility]. He will need financial assistance. Understands the importance of Brilinta and plans to call 1800 number for further assist. Gave pt videos to watch. He needs PCP. [phone]   Nathaniel Phillips CES, ACSM 09/27/2015 2:43 PM

## 2015-09-27 NOTE — Progress Notes (Signed)
Subjective:  No CP/SOB, POD #1 VF arrest/ resuscitation/ VDRF/ PCI DES prox LAD  Objective:  Temp:  [98 F (36.7 C)-98.8 F (37.1 C)] 98.4 F (36.9 C) (05/26 0400) Pulse Rate:  [0-135] 60 (05/26 0700) Resp:  [0-25] 23 (05/26 0700) BP: (97-231)/(46-148) 103/55 mmHg (05/26 0700) SpO2:  [0 %-100 %] 97 % (05/26 0700) Arterial Line BP: (110-172)/(65-97) 119/68 mmHg (05/25 1700) FiO2 (%):  [40 %-100 %] 40 % (05/25 1230) Weight:  [210 lb 5.1 oz (95.4 kg)-211 lb 10.3 oz (96 kg)] 210 lb 5.1 oz (95.4 kg) (05/25 1200) Weight change:   Intake/Output from previous day: 05/25 0701 - 05/26 0700 In: 1437 [P.O.:50; I.V.:1387] Out: 1735 [Urine:1735]  Intake/Output from this shift:    Physical Exam: General appearance: alert and no distress Neck: no adenopathy, no carotid bruit, no JVD, supple, symmetrical, trachea midline and thyroid not enlarged, symmetric, no tenderness/mass/nodules Lungs: clear to auscultation bilaterally Heart: regular rate and rhythm, S1, S2 normal, no murmur, click, rub or gallop Extremities: extremities normal, atraumatic, no cyanosis or edema and Groin OK  Lab Results: Results for orders placed or performed during the hospital encounter of 09/26/15 (from the past 48 hour(s))  Basic metabolic panel     Status: Abnormal   Collection Time: 09/26/15  8:00 AM  Result Value Ref Range   Sodium 135 135 - 145 mmol/L   Potassium 4.1 3.5 - 5.1 mmol/L   Chloride 104 101 - 111 mmol/L   CO2 22 22 - 32 mmol/L   Glucose, Bld 146 (H) 65 - 99 mg/dL   BUN 18 6 - 20 mg/dL   Creatinine, Ser 1.15 0.61 - 1.24 mg/dL   Calcium 9.8 8.9 - 10.3 mg/dL   GFR calc non Af Amer >60 >60 mL/min   GFR calc Af Amer >60 >60 mL/min    Comment: (NOTE) The eGFR has been calculated using the CKD EPI equation. This calculation has not been validated in all clinical situations. eGFR's persistently <60 mL/min signify possible Chronic Kidney Disease.    Anion gap 9 5 - 15  CBC     Status:  None   Collection Time: 09/26/15  8:00 AM  Result Value Ref Range   WBC 10.1 4.0 - 10.5 K/uL   RBC 5.16 4.22 - 5.81 MIL/uL   Hemoglobin 14.6 13.0 - 17.0 g/dL   HCT 44.3 39.0 - 52.0 %   MCV 85.9 78.0 - 100.0 fL   MCH 28.3 26.0 - 34.0 pg   MCHC 33.0 30.0 - 36.0 g/dL   RDW 13.5 11.5 - 15.5 %   Platelets 325 150 - 400 K/uL  I-stat troponin, ED     Status: Abnormal   Collection Time: 09/26/15  8:11 AM  Result Value Ref Range   Troponin i, poc 0.10 (HH) 0.00 - 0.08 ng/mL   Comment NOTIFIED PHYSICIAN    Comment 3            Comment: Due to the release kinetics of cTnI, a negative result within the first hours of the onset of symptoms does not rule out myocardial infarction with certainty. If myocardial infarction is still suspected, repeat the test at appropriate intervals.   POC CBG, ED     Status: Abnormal   Collection Time: 09/26/15  8:34 AM  Result Value Ref Range   Glucose-Capillary 129 (H) 65 - 99 mg/dL  Blood gas, arterial     Status: Abnormal   Collection Time: 09/26/15 12:03 PM  Result  Value Ref Range   FIO2 1.00    Delivery systems VENTILATOR    Mode PRESSURE REGULATED VOLUME CONTROL    VT 600 mL   LHR 14 resp/min   Peep/cpap 5.0 cm H20   pH, Arterial 7.283 (L) 7.350 - 7.450   pCO2 arterial 39.5 35.0 - 45.0 mmHg   pO2, Arterial 286 (H) 80.0 - 100.0 mmHg   Bicarbonate 18.1 (L) 20.0 - 24.0 mEq/L   TCO2 19.3 0 - 100 mmol/L   Acid-base deficit 7.4 (H) 0.0 - 2.0 mmol/L   O2 Saturation 99.3 %   Patient temperature 98.6    Collection site A-LINE    Drawn by [id number]    Sample type ARTERIAL DRAW    Allens test (pass/fail) PASS PASS  Urine rapid drug screen (hosp performed)     Status: Abnormal   Collection Time: 09/26/15  1:15 PM  Result Value Ref Range   Opiates NONE DETECTED NONE DETECTED   Cocaine NONE DETECTED NONE DETECTED   Benzodiazepines POSITIVE (A) NONE DETECTED   Amphetamines NONE DETECTED NONE DETECTED   Tetrahydrocannabinol POSITIVE (A) NONE DETECTED    Barbiturates NONE DETECTED NONE DETECTED    Comment:        DRUG SCREEN FOR MEDICAL PURPOSES ONLY.  IF CONFIRMATION IS NEEDED FOR ANY PURPOSE, NOTIFY LAB WITHIN 5 DAYS.        LOWEST DETECTABLE LIMITS FOR URINE DRUG SCREEN Drug Class       Cutoff (ng/mL) Amphetamine      1000 Barbiturate      200 Benzodiazepine   376 Tricyclics       283 Opiates          300 Cocaine          300 THC              50   Troponin I     Status: Abnormal   Collection Time: 09/26/15  4:34 PM  Result Value Ref Range   Troponin I 0.92 (HH) <0.031 ng/mL    Comment:        POSSIBLE MYOCARDIAL ISCHEMIA. SERIAL TESTING RECOMMENDED. CRITICAL RESULT CALLED TO, READ BACK BY AND VERIFIED WITH: Jeralyn Bennett RN [id number] [phone]   Troponin I     Status: Abnormal   Collection Time: 09/26/15  9:36 PM  Result Value Ref Range   Troponin I 0.88 (HH) <0.031 ng/mL    Comment:        POSSIBLE MYOCARDIAL ISCHEMIA. SERIAL TESTING RECOMMENDED. CRITICAL VALUE NOTED.  VALUE IS CONSISTENT WITH PREVIOUSLY REPORTED AND CALLED VALUE.   Basic metabolic panel     Status: Abnormal   Collection Time: 09/27/15  2:14 AM  Result Value Ref Range   Sodium 136 135 - 145 mmol/L   Potassium 4.1 3.5 - 5.1 mmol/L   Chloride 107 101 - 111 mmol/L   CO2 22 22 - 32 mmol/L   Glucose, Bld 117 (H) 65 - 99 mg/dL   BUN 16 6 - 20 mg/dL   Creatinine, Ser 1.14 0.61 - 1.24 mg/dL   Calcium 8.8 (L) 8.9 - 10.3 mg/dL   GFR calc non Af Amer >60 >60 mL/min   GFR calc Af Amer >60 >60 mL/min    Comment: (NOTE) The eGFR has been calculated using the CKD EPI equation. This calculation has not been validated in all clinical situations. eGFR's persistently <60 mL/min signify possible Chronic Kidney Disease.    Anion gap 7 5 - 15  CBC with  Differential/Platelet     Status: Abnormal   Collection Time: 09/27/15  2:14 AM  Result Value Ref Range   WBC 9.4 4.0 - 10.5 K/uL   RBC 4.49 4.22 - 5.81 MIL/uL   Hemoglobin 12.3 (L) 13.0 - 17.0 g/dL   HCT  37.7 (L) 39.0 - 52.0 %   MCV 84.0 78.0 - 100.0 fL   MCH 27.4 26.0 - 34.0 pg   MCHC 32.6 30.0 - 36.0 g/dL   RDW 13.6 11.5 - 15.5 %   Platelets 264 150 - 400 K/uL   Neutrophils Relative % 76 %   Neutro Abs 7.1 1.7 - 7.7 K/uL   Lymphocytes Relative 19 %   Lymphs Abs 1.8 0.7 - 4.0 K/uL   Monocytes Relative 5 %   Monocytes Absolute 0.5 0.1 - 1.0 K/uL   Eosinophils Relative 0 %   Eosinophils Absolute 0.0 0.0 - 0.7 K/uL   Basophils Relative 0 %   Basophils Absolute 0.0 0.0 - 0.1 K/uL    Imaging: Imaging results have been reviewed  Tele- NSR  Assessment/Plan:   1. Principal Problem: 2.   Cardiac arrest ([address]) 3. Active Problems: 4.   ST elevation myocardial infarction (STEMI) ([place]) 5.   STEMI (ST elevation myocardial infarction) ([place]) 6.   Time Spent Directly with Patient:  20 minutes  Length of Stay:  LOS: 1 day   POD #1 witnessed VF arrest (no arctic sun) / resuscitation / VDRF (extubated easily) / PCI and DES (3.25 X 18 Xience ) . No CP. Exam benign. Trop low (<1). Other labs OK. 2D shows mod severe LVD (EF 30-35% with severe inferior HK) which is surprising. D/C amio. Start low dose ACE I and BB. Tx tele. CRH. Ambulate. Will re check 2D Sunday morning and if > 35% can be D/Cd w/o a LifeVest. 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 On DAPT. CRF mod (D/C tob and cocaine).  Quay Burow 09/27/2015, 8:01 AM

## 2015-09-27 NOTE — Progress Notes (Signed)
50 ml Precedex wasted in sink with Prince RomeJessica Milford, RN

## 2015-09-27 NOTE — Progress Notes (Signed)
Spoke w pt. He and [place] live in [place]. Explained importance of brilinta. Gave pt 30day free card and enc pt to call 800 number to poss get another 60days free. Placed pt assist form on shadow chart for brilinta. Enc pt to call [place] dss or local hosp to see if free or sliding scale clinic avail in his county. Explained [place] match program that can assist w meds 1x/yr and get 34 days of meds with low copay. Match letter filled out and placed on shadow chart. Explained must go to one of drug stores listed that participates in program to get these meds for reduced rate.

## 2015-09-27 NOTE — Progress Notes (Signed)
Report called to 3W RN.  

## 2015-09-28 DIAGNOSIS — I2102 ST elevation (STEMI) myocardial infarction involving left anterior descending coronary artery: Secondary | ICD-10-CM

## 2015-09-28 LAB — GLUCOSE, CAPILLARY
GLUCOSE-CAPILLARY: 168 mg/dL — AB (ref 65–99)
GLUCOSE-CAPILLARY: 110 mg/dL — AB (ref 65–99)
Glucose-Capillary: 128 mg/dL — ABNORMAL HIGH (ref 65–99)
Glucose-Capillary: 111 mg/dL — ABNORMAL HIGH (ref 65–99)

## 2015-09-28 LAB — HEMOGLOBIN A1C
HEMOGLOBIN A1C: 6.5 % — AB (ref 4.8–5.6)
MEAN PLASMA GLUCOSE: 140 mg/dL

## 2015-09-28 NOTE — Progress Notes (Signed)
Phase l Cardiac Rehab  Patient is ambulating independently without difficulty.  Reinforced importance of drug free lifestyle.  Answered questions about Brillenta and how to take advantage of the lst 30 days free coupon, and the contact number for financial assistance.  Patient and wife verbalized understanding. [phone]

## 2015-09-28 NOTE — Progress Notes (Signed)
PRN Tylenol 650 mg given at 21:40 PM for c/o of headache, and was effective.

## 2015-09-28 NOTE — Progress Notes (Signed)
Patient ID: Nathaniel Phillips, male   DOB: 18-Dec-1975, 40 y.o.   MRN: [id number]     Subjective:  No chest pain or dyspnea Ambulating   Objective:  Temp:  [97.5 F (36.4 C)-99.2 F (37.3 C)] 97.5 F (36.4 C) (05/27 0911) Pulse Rate:  [50-70] 63 (05/27 0911) Resp:  [18-22] 18 (05/27 0911) BP: (110-147)/(50-71) 128/66 mmHg (05/27 0911) SpO2:  [97 %-100 %] 97 % (05/27 0911) Weight:  [99.292 kg (218 lb 14.4 oz)] 99.292 kg (218 lb 14.4 oz) (05/27 0500) Weight change: 3.292 kg (7 lb 4.1 oz)  Intake/Output from previous day: 05/26 0701 - 05/27 0700 In: 123 [P.O.:120; I.V.:3] Out: 900 [Urine:900]  Intake/Output from this shift: Total I/O In: 240 [P.O.:240] Out: -   Physical Exam: Affect appropriate Healthy:  appears stated age 11: normal Neck supple with no adenopathy JVP normal no bruits no thyromegaly Lungs clear with no wheezing and good diaphragmatic motion Heart:  S1/S2 no murmur, no rub, gallop or click PMI normal Abdomen: benighn, BS positve, no tenderness, no AAA no bruit.  No HSM or HJR Distal pulses intact with no bruits No edema Neuro non-focal Skin warm and dry No muscular weakness   Lab Results: Results for orders placed or performed during the hospital encounter of 09/26/15 (from the past 48 hour(s))  Blood gas, arterial     Status: Abnormal   Collection Time: 09/26/15 12:03 PM  Result Value Ref Range   FIO2 1.00    Delivery systems VENTILATOR    Mode PRESSURE REGULATED VOLUME CONTROL    VT 600 mL   LHR 14 resp/min   Peep/cpap 5.0 cm H20   pH, Arterial 7.283 (L) 7.350 - 7.450   pCO2 arterial 39.5 35.0 - 45.0 mmHg   pO2, Arterial 286 (H) 80.0 - 100.0 mmHg   Bicarbonate 18.1 (L) 20.0 - 24.0 mEq/L   TCO2 19.3 0 - 100 mmol/L   Acid-base deficit 7.4 (H) 0.0 - 2.0 mmol/L   O2 Saturation 99.3 %   Patient temperature 98.6    Collection site A-LINE    Drawn by [id number]    Sample type ARTERIAL DRAW    Allens test (pass/fail) PASS PASS  Urine rapid drug  screen (hosp performed)     Status: Abnormal   Collection Time: 09/26/15  1:15 PM  Result Value Ref Range   Opiates NONE DETECTED NONE DETECTED   Cocaine NONE DETECTED NONE DETECTED   Benzodiazepines POSITIVE (A) NONE DETECTED   Amphetamines NONE DETECTED NONE DETECTED   Tetrahydrocannabinol POSITIVE (A) NONE DETECTED   Barbiturates NONE DETECTED NONE DETECTED    Comment:        DRUG SCREEN FOR MEDICAL PURPOSES ONLY.  IF CONFIRMATION IS NEEDED FOR ANY PURPOSE, NOTIFY LAB WITHIN 5 DAYS.        LOWEST DETECTABLE LIMITS FOR URINE DRUG SCREEN Drug Class       Cutoff (ng/mL) Amphetamine      1000 Barbiturate      200 Benzodiazepine   326 Tricyclics       712 Opiates          300 Cocaine          300 THC              50   Troponin I     Status: Abnormal   Collection Time: 09/26/15  4:34 PM  Result Value Ref Range   Troponin I 0.92 (HH) <0.031 ng/mL    Comment:  POSSIBLE MYOCARDIAL ISCHEMIA. SERIAL TESTING RECOMMENDED. CRITICAL RESULT CALLED TO, READ BACK BY AND VERIFIED WITH: Jeralyn Bennett RN [id number] GREEN R   Troponin I     Status: Abnormal   Collection Time: 09/26/15  9:36 PM  Result Value Ref Range   Troponin I 0.88 (HH) <0.031 ng/mL    Comment:        POSSIBLE MYOCARDIAL ISCHEMIA. SERIAL TESTING RECOMMENDED. CRITICAL VALUE NOTED.  VALUE IS CONSISTENT WITH PREVIOUSLY REPORTED AND CALLED VALUE.   Basic metabolic panel     Status: Abnormal   Collection Time: 09/27/15  2:14 AM  Result Value Ref Range   Sodium 136 135 - 145 mmol/L   Potassium 4.1 3.5 - 5.1 mmol/L   Chloride 107 101 - 111 mmol/L   CO2 22 22 - 32 mmol/L   Glucose, Bld 117 (H) 65 - 99 mg/dL   BUN 16 6 - 20 mg/dL   Creatinine, Ser 1.14 0.61 - 1.24 mg/dL   Calcium 8.8 (L) 8.9 - 10.3 mg/dL   GFR calc non Af Amer >60 >60 mL/min   GFR calc Af Amer >60 >60 mL/min    Comment: (NOTE) The eGFR has been calculated using the CKD EPI equation. This calculation has not been validated in all clinical  situations. eGFR's persistently <60 mL/min signify possible Chronic Kidney Disease.    Anion gap 7 5 - 15  CBC with Differential/Platelet     Status: Abnormal   Collection Time: 09/27/15  2:14 AM  Result Value Ref Range   WBC 9.4 4.0 - 10.5 K/uL   RBC 4.49 4.22 - 5.81 MIL/uL   Hemoglobin 12.3 (L) 13.0 - 17.0 g/dL   HCT 37.7 (L) 39.0 - 52.0 %   MCV 84.0 78.0 - 100.0 fL   MCH 27.4 26.0 - 34.0 pg   MCHC 32.6 30.0 - 36.0 g/dL   RDW 13.6 11.5 - 15.5 %   Platelets 264 150 - 400 K/uL   Neutrophils Relative % 76 %   Neutro Abs 7.1 1.7 - 7.7 K/uL   Lymphocytes Relative 19 %   Lymphs Abs 1.8 0.7 - 4.0 K/uL   Monocytes Relative 5 %   Monocytes Absolute 0.5 0.1 - 1.0 K/uL   Eosinophils Relative 0 %   Eosinophils Absolute 0.0 0.0 - 0.7 K/uL   Basophils Relative 0 %   Basophils Absolute 0.0 0.0 - 0.1 K/uL  Hemoglobin A1c     Status: Abnormal   Collection Time: 09/27/15  2:14 AM  Result Value Ref Range   Hgb A1c MFr Bld 6.5 (H) 4.8 - 5.6 %    Comment: (NOTE)         Pre-diabetes: 5.7 - 6.4         Diabetes: >6.4         Glycemic control for adults with diabetes: <7.0    Mean Plasma Glucose 140 mg/dL    Comment: (NOTE) Performed At: [facility] [place], [place] [id number] Lindon Romp MD ZR:[id number]   Glucose, capillary     Status: None   Collection Time: 09/27/15 11:32 AM  Result Value Ref Range   Glucose-Capillary 82 65 - 99 mg/dL   Comment 1 Capillary Specimen   Glucose, capillary     Status: Abnormal   Collection Time: 09/27/15  4:24 PM  Result Value Ref Range   Glucose-Capillary 135 (H) 65 - 99 mg/dL  Glucose, capillary     Status: Abnormal   Collection Time: 09/27/15  9:13 PM  Result Value Ref Range   Glucose-Capillary 110 (H) 65 - 99 mg/dL  Glucose, capillary     Status: Abnormal   Collection Time: 09/28/15  8:09 AM  Result Value Ref Range   Glucose-Capillary 168 (H) 65 - 99 mg/dL   Comment 1 Notify RN     Imaging: Imaging results  have been reviewed  Tele- NSR no VT or arrhythmia   Current facility-administered medications:  .  0.9 %  sodium chloride infusion, 250 mL, Intravenous, PRN, Lorretta Harp, MD .  acetaminophen (TYLENOL) tablet 650 mg, 650 mg, Oral, Q4H PRN, Lorretta Harp, MD, 650 mg at 09/27/15 2140 .  amLODipine (NORVASC) tablet 5 mg, 5 mg, Oral, Daily, Lorretta Harp, MD, 5 mg at 09/27/15 0917 .  aspirin chewable tablet 81 mg, 81 mg, Oral, Daily, Lorretta Harp, MD, 81 mg at 09/27/15 7026 .  atorvastatin (LIPITOR) tablet 80 mg, 80 mg, Oral, q1800, Lorretta Harp, MD, 80 mg at 09/27/15 1800 .  carvedilol (COREG) tablet 3.125 mg, 3.125 mg, Oral, BID WC, Lorretta Harp, MD, 3.125 mg at 09/28/15 0911 .  dexmedetomidine (PRECEDEX) 400 MCG/100ML (4 mcg/mL) infusion, 0.4-1.2 mcg/kg/hr, Intravenous, Titrated, Lorretta Harp, MD, Stopped at 09/27/15 [phone] .  fentaNYL (SUBLIMAZE) 2,500 mcg in sodium chloride 0.9 % 250 mL (10 mcg/mL) infusion, 25-400 mcg/hr, Intravenous, Continuous, Awilda Bill, NP, Stopped at 09/26/15 1335 .  fentaNYL (SUBLIMAZE) bolus via infusion 50 mcg, 50 mcg, Intravenous, Q1H PRN, Awilda Bill, NP .  heparin injection 5,000 Units, 5,000 Units, Subcutaneous, Q8H, Raylene Miyamoto, MD, 5,000 Units at 09/28/15 [phone] .  hydrALAZINE (APRESOLINE) injection 10 mg, 10 mg, Intravenous, Q4H PRN, Lorretta Harp, MD .  insulin aspart (novoLOG) injection 0-5 Units, 0-5 Units, Subcutaneous, QHS, Raylene Miyamoto, MD, 0 Units at 09/27/15 2200 .  insulin aspart (novoLOG) injection 0-9 Units, 0-9 Units, Subcutaneous, TID WC, Raylene Miyamoto, MD, 2 Units at 09/28/15 0913 .  isosorbide mononitrate (IMDUR) 24 hr tablet 30 mg, 30 mg, Oral, Daily, Lorretta Harp, MD, 30 mg at 09/27/15 0917 .  lisinopril (PRINIVIL,ZESTRIL) tablet 2.5 mg, 2.5 mg, Oral, Daily, Lorretta Harp, MD, 2.5 mg at 09/27/15 2774 .  midazolam (VERSED) injection 2 mg, 2 mg, Intravenous, Q15 min PRN, Awilda Bill,  NP .  midazolam (VERSED) injection 2 mg, 2 mg, Intravenous, Q2H PRN, Awilda Bill, NP .  morphine 2 MG/ML injection 2 mg, 2 mg, Intravenous, Q1H PRN, Lorretta Harp, MD .  nitroGLYCERIN (NITROSTAT) SL tablet 0.4 mg, 0.4 mg, Sublingual, Q5 min PRN, Lorretta Harp, MD .  ondansetron [facility]) injection 4 mg, 4 mg, Intravenous, Q6H PRN, Lorretta Harp, MD .  sodium chloride flush (NS) 0.9 % injection 3 mL, 3 mL, Intravenous, Q12H, Lorretta Harp, MD, 3 mL at 09/27/15 2140 .  sodium chloride flush (NS) 0.9 % injection 3 mL, 3 mL, Intravenous, PRN, Lorretta Harp, MD .  ticagrelor [facility]) tablet 90 mg, 90 mg, Oral, BID, Lorretta Harp, MD, 90 mg at 09/27/15 2140  Assessment/Plan:   Principal Problem:   Cardiac arrest [facility]) Active Problems:   ST elevation myocardial infarction (STEMI) ([place])   STEMI (ST elevation myocardial infarction) ([place])   Acute respiratory failure ([place])    Post VF/VT arrest in setting of LAD infarct 5/25 with DES.  Echo 5/25 EF 30-35%  On coreg ( cocaine use a week ago ?) ACE and nitrates Plan per  JB was to repeat echo tomorrow IF EF above 35% can be D/c if less will need evaluation for life vest.    Drug Abuse:  mariajuana and cocaine a week ago discussed need for total cessation   Jenkins Rouge 09/28/2015, 10:23 AM

## 2015-09-29 ENCOUNTER — Inpatient Hospital Stay (HOSPITAL_COMMUNITY): Payer: Self-pay

## 2015-09-29 DIAGNOSIS — F1410 Cocaine abuse, uncomplicated: Secondary | ICD-10-CM

## 2015-09-29 DIAGNOSIS — I255 Ischemic cardiomyopathy: Secondary | ICD-10-CM | POA: Diagnosis present

## 2015-09-29 DIAGNOSIS — Z9861 Coronary angioplasty status: Secondary | ICD-10-CM

## 2015-09-29 DIAGNOSIS — I2510 Atherosclerotic heart disease of native coronary artery without angina pectoris: Secondary | ICD-10-CM

## 2015-09-29 LAB — ECHOCARDIOGRAM LIMITED
Height: 71 in
WEIGHTICAEL: 3232 [oz_av]

## 2015-09-29 LAB — GLUCOSE, CAPILLARY
GLUCOSE-CAPILLARY: 126 mg/dL — AB (ref 65–99)
GLUCOSE-CAPILLARY: 109 mg/dL — AB (ref 65–99)

## 2015-09-29 MED ORDER — LISINOPRIL 2.5 MG PO TABS: 2.5000 mg | ORAL_TABLET | Freq: Every day | ORAL | Status: DC

## 2015-09-29 MED ORDER — TICAGRELOR 90 MG PO TABS: 90.0000 mg | ORAL_TABLET | Freq: Two times a day (BID) | ORAL | Status: AC

## 2015-09-29 MED ORDER — CARVEDILOL 3.125 MG PO TABS: 3.1250 mg | ORAL_TABLET | Freq: Two times a day (BID) | ORAL | Status: AC

## 2015-09-29 MED ORDER — PANTOPRAZOLE SODIUM 40 MG PO TBEC: 40.0000 mg | DELAYED_RELEASE_TABLET | Freq: Every day | ORAL | Status: DC

## 2015-09-29 MED ORDER — ACETAMINOPHEN 325 MG PO TABS: 650.0000 mg | ORAL_TABLET | ORAL | Status: AC | PRN

## 2015-09-29 MED ORDER — LISINOPRIL 2.5 MG PO TABS: 2.5000 mg | ORAL_TABLET | Freq: Every day | ORAL | Status: AC

## 2015-09-29 MED ORDER — CARVEDILOL 3.125 MG PO TABS: 3.1250 mg | ORAL_TABLET | Freq: Two times a day (BID) | ORAL | Status: DC

## 2015-09-29 MED ORDER — TICAGRELOR 90 MG PO TABS: 90.0000 mg | ORAL_TABLET | Freq: Two times a day (BID) | ORAL | Status: DC

## 2015-09-29 NOTE — Progress Notes (Signed)
Received phone call back from patient stating his pharmacy would not give him 30 day free Brilinta dose with prescription card.  Patient and wife stated pharmacy was now closed.  Assisted patient with finding 24 hour pharmacy near by.  Contacted [facility] in [place] [address] [place].  Patient called back and stated [facility] filled prescription for free.  Colman Caterarpley, Josie Burleigh Danielle

## 2015-09-29 NOTE — Discharge Summary (Signed)
Discharge Summary    Patient ID: Nathaniel Phillips,  MRN: [id number], DOB/AGE: 09-28-1975 40 y.o.  Admit date: 09/26/2015 Discharge date: 09/29/2015  Primary Care Provider: No primary care provider on file. Primary Cardiologist: Dr Allyson SabalBerry  Discharge Diagnoses    Principal Problem:   Cardiac arrest [facility](HCC) Active Problems:   Ventricular fibrillation (HCC)   ST elevation myocardial infarction (STEMI) (HCC)   Acute respiratory failure (HCC)   CAD S/P PCI-LAD DES 09/26/15   Cardiomyopathy, ischemic-EF 40-45% at discharge   Allergies No Known Allergies  Diagnostic Studies/Procedures    Urgent cath/PCI 09/26/15 Echocardiogram 09/29/15 _____________   History of Present Illness     40 y/o male presented 09/26/15 with VF arrest  Hospital Course      40 y.o.AA male with past medical history of Type 2 DM and substance abuse (Cocaine and Marijuana) who [facility] for evaluation of chest pain.   Upon arrival to the room, he was in refratory VT/VF. He was immediately shocked, but remained in VF. He received 300 mg IV Amiodarone along with IV lidocaine (100mg ) and Magnesium (2g). CPR was administered and he was shocked with 200J and converted to junctional and eventual sinus rhythm. His tracing showed diffuse ST elevation and he was intubated while in the ED and taken to the cath lab for emergent cardiac catheterization.  History is obtained by review of the chart and Care Everywhere. He reported having a [facility] the [facility] that revealed moderate LAD disease. He was placed on ASA and Plavix at that time. He had been experiencing intermittent CP for the past few days and took 2 SL NTG the AM of admission which helped with the pain. He then became diaphoretic and had a syncopal event at work and EMS was called. Upon arrival to the ED, he was coherent and talking with the Physician but then subsequently coded. He reported using Cocaine and  Marijuana one week ago.  He was taken urgently to the cath lab as noted. Cath revealed 70% proximal LAD that was dilated and stented using a DES by Dr Allyson SabalBerry. Limited echo on 09/26/15 revealed an EF of 30-35%. The pt was placed on medical Rx and transferred to telemetry. Echo done before discharge showed an improvement in his LVF to 45%- (no Life Vest needed). He'll need an early OP follow up (TOC).  _____________  Discharge Vitals Blood pressure 137/81, pulse 59, temperature 97.9 F (36.6 C), temperature source Oral, resp. rate 18, height 5\' 11"  (1.803 m), weight 202 lb (91.627 kg), SpO2 98 %.  Filed Weights   09/26/15 1200 09/28/15 0500 09/29/15 0500  Weight: 210 lb 5.1 oz (95.4 kg) 218 lb 14.4 oz (99.292 kg) 202 lb (91.627 kg)    Labs & Radiologic Studies    CBC  Recent Labs  09/27/15 0214  WBC 9.4  NEUTROABS 7.1  HGB 12.3*  HCT 37.7*  MCV 84.0  PLT 264   Basic Metabolic Panel  Recent Labs  09/27/15 0214  NA 136  K 4.1  CL 107  CO2 22  GLUCOSE 117*  BUN 16  CREATININE 1.14  CALCIUM 8.8*   Liver Function Tests No results for input(s): AST, ALT, ALKPHOS, BILITOT, PROT, ALBUMIN in the last 72 hours. No results for input(s): LIPASE, AMYLASE in the last 72 hours. Cardiac Enzymes  Recent Labs  09/26/15 1634 09/26/15 2136  TROPONINI 0.92* 0.88*   BNP Invalid input(s): POCBNP D-Dimer No results for  input(s): DDIMER in the last 72 hours. Hemoglobin A1C  Recent Labs  09/27/15 0214  HGBA1C 6.5*   Fasting Lipid Panel No results for input(s): CHOL, HDL, LDLCALC, TRIG, CHOLHDL, LDLDIRECT in the last 72 hours. Thyroid Function Tests No results for input(s): TSH, T4TOTAL, T3FREE, THYROIDAB in the last 72 hours.  Invalid input(s): FREET3 _____________  Dg Chest 2 View  09/26/2015  CLINICAL DATA:  40 year old male with chest pain and recent myocardial infarction last week EXAM: CHEST  2 VIEW COMPARISON:  None. FINDINGS: The lungs are clear and negative for focal  airspace consolidation, pulmonary edema or suspicious pulmonary nodule. No pleural effusion or pneumothorax. Cardiac and mediastinal contours are within normal limits. No acute fracture or lytic or blastic osseous lesions. The visualized upper abdominal bowel gas pattern is unremarkable. IMPRESSION: Negative chest x-ray Electronically Signed   By: Malachy Moan M.D.   On: 09/26/2015 08:25   Dg Chest Port 1 View  09/27/2015  CLINICAL DATA:  Acute respiratory failure EXAM: PORTABLE CHEST 1 VIEW COMPARISON:  09/26/2015 FINDINGS: Mild cardiomegaly. No confluent airspace opacities, effusions or edema. No acute bony abnormality. IMPRESSION: Borderline cardiomegaly. Electronically Signed   By: Charlett Nose M.D.   On: 09/27/2015 07:02   Disposition   Pt is being discharged home today in good condition.  Follow-up Plans & Appointments     Discharge Instructions    AMB Referral to Cardiac Rehabilitation - Phase II    Complete by:  As directed   Diagnosis:  STEMI     Amb Referral to Cardiac Rehabilitation    Complete by:  As directed   Diagnosis:   STEMI PTCA Coronary Stents             Discharge Medications   Current Discharge Medication List    CONTINUE these medications which have NOT CHANGED   Details  amLODipine (NORVASC) 5 MG tablet Take 5 mg by mouth daily.    aspirin EC 81 MG tablet Take 81 mg by mouth daily.    atorvastatin (LIPITOR) 80 MG tablet Take 80 mg by mouth daily.    clopidogrel (PLAVIX) 75 MG tablet Take 75 mg by mouth daily.    isosorbide mononitrate (IMDUR) 30 MG 24 hr tablet Take 30 mg by mouth daily.    metFORMIN (GLUCOPHAGE) 500 MG tablet Take 500 mg by mouth 2 (two) times daily with a meal.    nicotine (NICODERM CQ - DOSED IN MG/24 HOURS) 14 mg/24hr patch Place 14 mg onto the skin daily.    nitroGLYCERIN (NITROSTAT) 0.4 MG SL tablet Place 0.4 mg under the tongue every 5 (five) minutes as needed for chest pain.         Aspirin prescribed at  discharge?  Yes High Intensity Statin Prescribed? (Lipitor 40-80mg  or Crestor 20-40mg ): Yes Beta Blocker Prescribed? Yes For EF <40%, was ACEI/ARB Prescribed? No: NA ADP Receptor Inhibitor Prescribed? (i.e. Plavix etc.-Includes Medically Managed Patients): Yes For EF <40%, Aldosterone Inhibitor Prescribed? No: NA Was EF assessed during THIS hospitalization? Yes Was Cardiac Rehab II ordered? (Included Medically managed Patients): Yes   Outstanding Labs/Studies     Duration of Discharge Encounter   Greater than 30 minutes including physician time.  Jolene Provost PA 09/29/2015, 12:41 PM

## 2015-09-29 NOTE — Progress Notes (Signed)
  Echocardiogram 2D Echocardiogram has been performed.  Nathaniel Phillips 09/29/2015, 10:13 AM

## 2015-09-29 NOTE — Progress Notes (Signed)
Patient Name: Nathaniel Phillips Date of Encounter: 09/29/2015  Principal Problem:   Cardiac arrest [facility]) Active Problems:   ST elevation myocardial infarction (STEMI) (HCC)   STEMI (ST elevation myocardial infarction) (HCC)   Acute respiratory failure (HCC)   Length of Stay: 3  SUBJECTIVE  The patient is chest pain-free this morning his insertion site in his right groin shows no sign of bleeding there is no bruit and very good peripheral pulses.  CURRENT MEDS . amLODipine  5 mg Oral Daily  . aspirin  81 mg Oral Daily  . atorvastatin  80 mg Oral q1800  . carvedilol  3.125 mg Oral BID WC  . heparin subcutaneous  5,000 Units Subcutaneous Q8H  . insulin aspart  0-5 Units Subcutaneous QHS  . insulin aspart  0-9 Units Subcutaneous TID WC  . isosorbide mononitrate  30 mg Oral Daily  . lisinopril  2.5 mg Oral Daily  . sodium chloride flush  3 mL Intravenous Q12H  . ticagrelor  90 mg Oral BID    OBJECTIVE  Filed Vitals:   09/28/15 1614 09/28/15 2100 09/29/15 0500 09/29/15 0807  BP: 128/76 121/68 132/81 137/81  Pulse:  57 71 59  Temp: 97.5 F (36.4 C) 98.2 F (36.8 C) 97.9 F (36.6 C)   TempSrc: Oral Oral Oral   Resp:      Height:      Weight:   202 lb (91.627 kg)   SpO2:  100% 98%     Intake/Output Summary (Last 24 hours) at 09/29/15 1219 Last data filed at 09/29/15 0909  Gross per 24 hour  Intake    420 ml  Output      0 ml  Net    420 ml   Filed Weights   09/26/15 1200 09/28/15 0500 09/29/15 0500  Weight: 210 lb 5.1 oz (95.4 kg) 218 lb 14.4 oz (99.292 kg) 202 lb (91.627 kg)    PHYSICAL EXAM  General: Pleasant, NAD. Neuro: Alert and oriented X 3. Moves all extremities spontaneously. Psych: Normal affect. HEENT:  Normal  Neck: Supple without bruits or JVD. Lungs:  Resp regular and unlabored, CTA. Heart: RRR no s3, s4, or murmurs. Abdomen: Soft, non-tender, non-distended, BS + x 4.  Extremities: No clubbing, cyanosis or edema. DP/PT/Radials 2+ and equal  bilaterally.  Accessory Clinical Findings  CBC  Recent Labs  09/27/15 0214  WBC 9.4  NEUTROABS 7.1  HGB 12.3*  HCT 37.7*  MCV 84.0  PLT 264   Basic Metabolic Panel  Recent Labs  09/27/15 0214  NA 136  K 4.1  CL 107  CO2 22  GLUCOSE 117*  BUN 16  CREATININE 1.14  CALCIUM 8.8*    Recent Labs  09/26/15 1634 09/26/15 2136  TROPONINI 0.92* 0.88*    Recent Labs  09/27/15 0214  HGBA1C 6.5*   Radiology/Studies  Dg Chest 2 View  09/26/2015  CLINICAL DATA:  40 year old male with chest pain and recent myocardial infarction last week EXAM: CHEST  2 VIEW COMPARISON:  None. FINDINGS: The lungs are clear and negative for focal airspace consolidation, pulmonary edema or suspicious pulmonary nodule. No pleural effusion or pneumothorax. Cardiac and mediastinal contours are within normal limits. No acute fracture or lytic or blastic osseous lesions. The visualized upper abdominal bowel gas pattern is unremarkable. IMPRESSION: Negative chest x-ray Electronically Signed   By: Malachy Moan M.D.   On: 09/26/2015 08:25   Dg Chest Port 1 View  09/27/2015  CLINICAL DATA:  Acute respiratory  failure EXAM: PORTABLE CHEST 1 VIEW COMPARISON:  09/26/2015 FINDINGS: Mild cardiomegaly. No confluent airspace opacities, effusions or edema. No acute bony abnormality. IMPRESSION: Borderline cardiomegaly. Electronically Signed   By: Charlett NoseKevin  Dover M.D.   On: 09/27/2015 07:02   TELE: SR   ASSESSMENT AND PLAN  Principal Problem: - Cardiac arrest [facility](HCC) Active Problems: - STEMI (ST elevation myocardial infarction) (HCC) -  Acute respiratory failure (HCC)  Post VF/VT arrest in setting of LAD infarct 5/25 with DES. Echo 5/25 EF 30-35% On coreg ( cocaine use a week ago ?) ACE and nitrates, BP controlled.  His echo today shows improved LVEF from 30-35%, now 45% with global hypokinesis. We will discharge, no need for a Lifevest. Advised strongly not to use cocaine.   Drug Abuse: mariajuana  and cocaine a week ago discussed need for total cessation   We will discharge today and arrange for an outpatient follow-up.  Signed, Tobias AlexanderKatarina Jameca Chumley MD, [facility] 09/29/2015

## 2015-09-29 NOTE — Discharge Instructions (Signed)
Heart Attack A heart attack (myocardial infarction, MI) causes damage to the heart that cannot be fixed. A heart attack often happens when a blood clot or other blockage cuts blood flow to the heart. When this happens, certain areas of the heart begin to die. This causes the pain you feel during a heart attack. HOME CARE  Take medicine as told by your doctor. You may need medicine to:  Keep your blood from clotting too easily.  Control your blood pressure.  Lower your cholesterol.  Control abnormal heart rhythms.  Change certain behaviors as told by your doctor. This may include:  Quitting smoking.  Being active.  Eating a heart-healthy diet. Ask your doctor for help with this diet.  Keeping a healthy weight.  Keeping your diabetes under control.  Lessening stress.  Limiting how much alcohol you drink. Do not take these medicines unless your doctor says that you can:  Nonsteroidal anti-inflammatory drugs (NSAIDs). These include:  Ibuprofen.  Naproxen.  Celecoxib.  Vitamin supplements that have vitamin A, vitamin E, or both.  Hormone therapy that contains estrogen with or without progestin. GET HELP RIGHT AWAY IF:  You have sudden chest discomfort.  You have sudden discomfort in your:  Arms.  Back.  Neck.  Jaw.  You have shortness of breath at any time.  You have sudden sweating or clammy skin.  You feel sick to your stomach (nauseous) or throw up (vomit).  You suddenly get light-headed or dizzy.  You feel your heart beating fast or skipping beats. These symptoms may be an emergency. Do not wait to see if the symptoms will go away. Get medical help right away. Call your local emergency services (911 in the [place] Do not drive yourself to the hospital.   This information is not intended to replace advice given to you by your health care provider. Make sure you discuss any questions you have with your health care provider.   Document Released:  10/20/2011 Document Revised: 09/04/2014 Document Reviewed: 06/23/2013 Elsevier Interactive Patient Education 2016 Elsevier Inc.  Heart-Healthy Eating Plan Many factors influence your heart health, including eating and exercise habits. Heart (coronary) risk increases with abnormal blood fat (lipid) levels. Heart-healthy meal planning includes limiting unhealthy fats, increasing healthy fats, and making other small dietary changes. This includes maintaining a healthy body weight to help keep lipid levels within a normal range. WHAT IS MY PLAN?  Your health care provider recommends that you:  Get no more than _________% of the total calories in your daily diet from fat.  Limit your intake of saturated fat to less than _________% of your total calories each day.  Limit the amount of cholesterol in your diet to less than _________ mg per day. WHAT TYPES OF FAT SHOULD I CHOOSE?  Choose healthy fats more often. Choose monounsaturated and polyunsaturated fats, such as olive oil and canola oil, flaxseeds, walnuts, almonds, and seeds.  Eat more omega-3 fats. Good choices include salmon, mackerel, sardines, tuna, flaxseed oil, and ground flaxseeds. Aim to eat fish at least two times each week.  Limit saturated fats. Saturated fats are primarily found in animal products, such as meats, butter, and cream. Plant sources of saturated fats include palm oil, palm kernel oil, and coconut oil.  Avoid foods with partially hydrogenated oils in them. These contain trans fats. Examples of foods that contain trans fats are stick margarine, some tub margarines, cookies, crackers, and other baked goods. WHAT GENERAL GUIDELINES DO I NEED TO FOLLOW?  Check  food labels carefully to identify foods with trans fats or high amounts of saturated fat.  Fill one half of your plate with vegetables and green salads. Eat 4-5 servings of vegetables per day. A serving of vegetables equals 1 cup of raw leafy vegetables,  cup of  raw or cooked cut-up vegetables, or  cup of vegetable juice.  Fill one fourth of your plate with whole grains. Look for the word "whole" as the first word in the ingredient list.  Fill one fourth of your plate with lean protein foods.  Eat 4-5 servings of fruit per day. A serving of fruit equals one medium whole fruit,  cup of dried fruit,  cup of fresh, frozen, or canned fruit, or  cup of 100% fruit juice.  Eat more foods that contain soluble fiber. Examples of foods that contain this type of fiber are apples, broccoli, carrots, beans, peas, and barley. Aim to get 20-30 g of fiber per day.  Eat more home-cooked food and less restaurant, buffet, and fast food.  Limit or avoid alcohol.  Limit foods that are high in starch and sugar.  Avoid fried foods.  Cook foods by using methods other than frying. Baking, boiling, grilling, and broiling are all great options. Other fat-reducing suggestions include:  Removing the skin from poultry.  Removing all visible fats from meats.  Skimming the fat off of stews, soups, and gravies before serving them.  Steaming vegetables in water or broth.  Lose weight if you are overweight. Losing just 5-10% of your initial body weight can help your overall health and prevent diseases such as diabetes and heart disease.  Increase your consumption of nuts, legumes, and seeds to 4-5 servings per week. One serving of dried beans or legumes equals  cup after being cooked, one serving of nuts equals 1 ounces, and one serving of seeds equals  ounce or 1 tablespoon.  You may need to monitor your salt (sodium) intake, especially if you have high blood pressure. Talk with your health care provider or dietitian to get more information about reducing sodium. WHAT FOODS CAN I EAT? Grains Breads, including [place], white, pita, wheat, raisin, rye, oatmeal, and [place]. Tortillas that are neither fried nor made with lard or trans fat. Low-fat rolls, including  hotdog and hamburger buns and English muffins. Biscuits. Muffins. Waffles. Pancakes. Light popcorn. Whole-grain cereals. Flatbread. Melba toast. Pretzels. Breadsticks. Rusks. Low-fat snacks and crackers, including oyster, saltine, matzo, graham, animal, and rye. Rice and pasta, including brown rice and those that are made with whole wheat. Vegetables All vegetables. Fruits All fruits, but limit coconut. Meats and Other Protein Sources Lean, well-trimmed beef, veal, pork, and lamb. Chicken and [place] without skin. All fish and shellfish. Wild duck, rabbit, pheasant, and venison. Egg whites or low-cholesterol egg substitutes. Dried beans, peas, lentils, and tofu.Seeds and most nuts. Dairy Low-fat or nonfat cheeses, including ricotta, string, and mozzarella. Skim or 1% milk that is liquid, powdered, or evaporated. Buttermilk that is made with low-fat milk. Nonfat or low-fat yogurt. Beverages Mineral water. Diet carbonated beverages. Sweets and Desserts Sherbets and fruit ices. Honey, jam, marmalade, jelly, and syrups. Meringues and gelatins. Pure sugar candy, such as hard candy, jelly beans, gumdrops, mints, marshmallows, and small amounts of dark chocolate. [employer]. Eat all sweets and desserts in moderation. Fats and Oils Nonhydrogenated (trans-free) margarines. Vegetable oils, including soybean, sesame, sunflower, olive, peanut, safflower, corn, canola, and cottonseed. Salad dressings or mayonnaise that are made with a vegetable oil. Limit  added fats and oils that you use for cooking, baking, salads, and as spreads. Other Cocoa powder. Coffee and tea. All seasonings and condiments. The items listed above may not be a complete list of recommended foods or beverages. Contact your dietitian for more options. WHAT FOODS ARE NOT RECOMMENDED? Grains Breads that are made with saturated or trans fats, oils, or whole milk. Croissants. Butter rolls. Cheese breads. Sweet rolls. Donuts. Buttered  popcorn. Chow mein noodles. High-fat crackers, such as cheese or butter crackers. Meats and Other Protein Sources Fatty meats, such as hotdogs, short ribs, sausage, spareribs, bacon, ribeye roast or steak, and mutton. High-fat deli meats, such as salami and bologna. Caviar. Domestic duck and goose. Organ meats, such as kidney, liver, sweetbreads, brains, gizzard, chitterlings, and heart. Dairy Cream, sour cream, cream cheese, and creamed cottage cheese. Whole milk cheeses, including blue (bleu), [address], [place], [place], [address], [place], [address], [place], [place], and [place]. Whole or 2% milk that is liquid, evaporated, or condensed. Whole buttermilk. Cream sauce or high-fat cheese sauce. Yogurt that is made from whole milk. Beverages Regular sodas and drinks with added sugar. Sweets and Desserts Frosting. Pudding. Cookies. Cakes other than angel food cake. Candy that has milk chocolate or white chocolate, hydrogenated fat, butter, coconut, or unknown ingredients. Buttered syrups. Full-fat ice cream or ice cream drinks. Fats and Oils Gravy that has suet, meat fat, or shortening. Cocoa butter, hydrogenated oils, palm oil, coconut oil, palm kernel oil. These can often be found in baked products, candy, fried foods, nondairy creamers, and whipped toppings. Solid fats and shortenings, including bacon fat, salt pork, lard, and butter. Nondairy cream substitutes, such as coffee creamers and sour cream substitutes. Salad dressings that are made of unknown oils, cheese, or sour cream. The items listed above may not be a complete list of foods and beverages to avoid. Contact your dietitian for more information.   This information is not intended to replace advice given to you by your health care provider. Make sure you discuss any questions you have with your health care provider.   Document Released: 01/28/2008 Document Revised: 05/11/2014 Document Reviewed: 10/12/2013 Elsevier Interactive Patient  Education 2016 Elsevier Inc.    Coronary Angiogram With Stent, Care After Refer to this sheet in the next few weeks. These instructions provide you with information about caring for yourself after your procedure. Your health care provider may also give you more specific instructions. Your treatment has been planned according to current medical practices, but problems sometimes occur. Call your health care provider if you have any problems or questions after your procedure. WHAT TO EXPECT AFTER THE PROCEDURE  After your procedure, it is typical to have the following:  Bruising at the catheter insertion site that usually fades within 1-2 weeks.  Blood collecting in the tissue (hematoma) that may be painful to the touch. It should usually decrease in size and tenderness within 1-2 weeks. HOME CARE INSTRUCTIONS  Take medicines only as directed by your health care provider. Blood thinners may be prescribed after your procedure to improve blood flow through the stent.  You may shower 24-48 hours after the procedure or as directed by your health care provider. Remove the bandage (dressing) and gently wash the catheter insertion site with plain soap and water. Pat the area dry with a clean towel. Do not rub the site, because this may cause bleeding.  Do not take baths, swim, or use a hot tub until your health care provider approves.  Check your catheter  insertion site every day for redness, swelling, or drainage.  Do not apply powder or lotion to the site.  Do not lift over 10 lb (4.5 kg) for 5 days after your procedure or as directed by your health care provider.  Ask your health care provider when it is okay to:  Return to work or school.  Resume usual physical activities or sports.  Resume sexual activity.  Eat a heart-healthy diet. This should include plenty of fresh fruits and vegetables. Meat should be lean cuts. Avoid the following types of food:  Food that is high in  salt.  Canned or highly processed food.  Food that is high in saturated fat or sugar.  Fried food.  Make any other lifestyle changes as recommended by your health care provider. These may include:  Not using any tobacco products, including cigarettes, chewing tobacco, or electronic cigarettes.If you need help quitting, ask your health care provider.  Managing your weight.  Getting regular exercise.  Managing your blood pressure.  Limiting your alcohol intake.  Managing other health problems, such as diabetes.  If you need an MRI after your heart stent has been placed, be sure to tell the health care provider who orders the MRI that you have a heart stent.  Keep all follow-up visits as directed by your health care provider. This is important. SEEK MEDICAL CARE IF:  You have a fever.  You have chills.  You have increased bleeding from the catheter insertion site. Hold pressure on the site. SEEK IMMEDIATE MEDICAL CARE IF:  You develop chest pain or shortness of breath, feel faint, or pass out.  You have unusual pain at the catheter insertion site.  You have redness, warmth, or swelling at the catheter insertion site.  You have drainage (other than a small amount of blood on the dressing) from the catheter insertion site.  The catheter insertion site is bleeding, and the bleeding does not stop after 30 minutes of holding steady pressure on the site.  You develop bleeding from any other place, such as from your rectum. There may be bright red blood in your urine or stool, or it may appear as black, tarry stool.   This information is not intended to replace advice given to you by your health care provider. Make sure you discuss any questions you have with your health care provider.   Document Released: 11/07/2004 Document Revised: 05/11/2014 Document Reviewed: 09/12/2012 Elsevier Interactive Patient Education [employer]

## 2015-10-02 ENCOUNTER — Telehealth: Payer: Self-pay | Admitting: Cardiovascular Disease

## 2015-10-02 NOTE — Telephone Encounter (Signed)
Close encounter 

## 2015-10-09 ENCOUNTER — Telehealth: Payer: Self-pay | Admitting: Cardiovascular Disease

## 2015-10-09 NOTE — Telephone Encounter (Signed)
Closed encounter °

## 2015-10-15 ENCOUNTER — Ambulatory Visit: Payer: Self-pay | Admitting: Cardiovascular Disease

## 2015-10-16 ENCOUNTER — Ambulatory Visit (INDEPENDENT_AMBULATORY_CARE_PROVIDER_SITE_OTHER): Payer: Self-pay | Admitting: Nurse Practitioner

## 2015-10-16 ENCOUNTER — Encounter: Payer: Self-pay | Admitting: Nurse Practitioner

## 2015-10-16 VITALS — BP 120/72 | HR 72 | Ht 71.0 in | Wt 202.8 lb

## 2015-10-16 DIAGNOSIS — E119 Type 2 diabetes mellitus without complications: Secondary | ICD-10-CM | POA: Insufficient documentation

## 2015-10-16 DIAGNOSIS — Z79899 Other long term (current) drug therapy: Secondary | ICD-10-CM

## 2015-10-16 DIAGNOSIS — I255 Ischemic cardiomyopathy: Secondary | ICD-10-CM | POA: Insufficient documentation

## 2015-10-16 DIAGNOSIS — E785 Hyperlipidemia, unspecified: Secondary | ICD-10-CM

## 2015-10-16 DIAGNOSIS — F1410 Cocaine abuse, uncomplicated: Secondary | ICD-10-CM | POA: Insufficient documentation

## 2015-10-16 DIAGNOSIS — F1910 Other psychoactive substance abuse, uncomplicated: Secondary | ICD-10-CM

## 2015-10-16 DIAGNOSIS — Z720 Tobacco use: Secondary | ICD-10-CM | POA: Insufficient documentation

## 2015-10-16 DIAGNOSIS — F1290 Cannabis use, unspecified, uncomplicated: Secondary | ICD-10-CM | POA: Insufficient documentation

## 2015-10-16 DIAGNOSIS — I201 Angina pectoris with documented spasm: Secondary | ICD-10-CM | POA: Insufficient documentation

## 2015-10-16 DIAGNOSIS — I2510 Atherosclerotic heart disease of native coronary artery without angina pectoris: Secondary | ICD-10-CM | POA: Insufficient documentation

## 2015-10-16 DIAGNOSIS — I213 ST elevation (STEMI) myocardial infarction of unspecified site: Secondary | ICD-10-CM

## 2015-10-16 DIAGNOSIS — I739 Peripheral vascular disease, unspecified: Secondary | ICD-10-CM

## 2015-10-16 NOTE — Patient Instructions (Signed)
Medication Instructions:  Continue current medications  Labwork: Fasting Lipid Liver  Testing/Procedures: Your physician has requested that you have an ankle brachial index (ABI). During this test an ultrasound and blood pressure cuff are used to evaluate the arteries that supply the arms and legs with blood. Allow thirty minutes for this exam. There are no restrictions or special instructions.  Follow-Up: Your physician recommends that you schedule a follow-up appointment in: 2-3 Months with Dr Allyson SabalBerry   Any Other Special Instructions Will Be Listed Below (If Applicable).  If you need a refill on your cardiac medications before your next appointment, please call your pharmacy.

## 2015-10-16 NOTE — Progress Notes (Signed)
Office Visit    Patient Name: Nathaniel Phillips Date of Encounter: 10/16/2015  Primary Care Provider:  No primary care provider on file. Primary Cardiologist:  Erlene Quan, MD   Chief Complaint    40 year old male status post recent VF arrest and LAD stenting, who presents for follow-up.  Past Medical History    Past Medical History  Diagnosis Date  . Diabetes mellitus without complication (HCC)     a. 09/2015 A1c 6.5%.  Marland Kitchen CAD (coronary artery disease)     a. 09/23/15 NSTEMI/Cath @ [facility]: LAD 60% vasospasm - improved to 30-40% after IC NTG-->Med Rx, EF 50%;  b. 09/26/2015 VF Arrest/PCI: LM nl, LAD 70p (3.25x18 Xience DES), otw nl cors.  . Cardiac arrest (HCC)     a. 09/2015 VF Arrest  . Ischemic cardiomyopathy     a. 09/26/15 Echo: EF 30-35%, sev inf HK;  b. 09/29/2015 Echo: EF 45%, diff HK, Gr1 DD, mild LVH.  . Tobacco abuse     a. quit 09/2015  . Marijuana use     a. quit 09/2015  . Cocaine abuse     a. quit 09/2015  . Coronary vasospasm (HCC)     a. 09/2015 documented on cath @ [facility].   Past Surgical History  Procedure Laterality Date  . Cardiac catheterization N/A 09/26/2015    Procedure: Left Heart Cath and Coronary Angiography;  Surgeon: Runell Gess, MD;  Location: [facility] INVASIVE CV LAB;  Service: Cardiovascular;  Laterality: N/A;  . Cardiac catheterization N/A 09/26/2015    Procedure: Coronary Stent Intervention;  Surgeon: Runell Gess, MD;  Location: [facility] INVASIVE CV LAB;  Service: Cardiovascular;  Laterality: N/A;  . Cardiac catheterization N/A 09/26/2015    Procedure: Intravascular Pressure Wire/FFR Study;  Surgeon: Runell Gess, MD;  Location: [facility] INVASIVE CV LAB;  Service: Cardiovascular;  Laterality: N/A;    Allergies  No Known Allergies  History of Present Illness    40 year old male with prior history of polysubstance abuse including tobacco, cocaine, and marijuana. In late May, he was admitted to [facility] with a several day history of intermittent chest pain. He  ruled in for non-STEMI. Echo showed EF of 50%. Cath showed coronary vasospasm predominantly involving the proximal LAD. He was placed on nitrates and calcium channel blocker and advised to avoid cocaine. 2 days following discharge, he had recurrent chest discomfort for which he took sublingual nitroglycerin. This was followed by diaphoresis and then syncope. EMS was called and he was taken to the [place]. He was coherent upon arrival but then developed refractory ventricular tachycardia and fibrillation requiring CPR and defibrillation. He was intubated. ECG showed diffuse ST elevation and he was taken to the Cath Lab where diagnostic catheterization revealed a 70% proximal stenosis in the LAD with otherwise normal coronary arteries. The LAD was successfully stented using drug-eluting stent. He was recovered in the coronary intensive care unit and subsequently extubated. Echocardiogram initially showed an EF of 30-35% with severe inferior hypokinesis. Follow-up echo showed an EF of 45% and thus he did not require LifeVest placement. He was placed on aspirin, statin, beta blocker, ACE inhibitor, Brilinta, nitrate, and calcium channel blocker therapy and later discharged. Since discharge, he has done reasonably well. He is adjusting to being on medication and notes that he sometimes feels lightheaded in the morning but this doesn't generally last long. He has not been having any significant chest discomfort but has had occasional fleeting episodes of chest discomfort with activity.  He has also noted left calf discomfort with prolonged ambulation that resolves with rest. He did not remember experiencing this prior to his events. He denies PND, orthopnea, dizziness, syncope, edema, or early satiety. He has quit smoking and has not been using marijuana or cocaine. He is committed to remaining off of these agents.  Home Medications    Prior to Admission medications   Medication Sig Start Date End Date Taking?  Authorizing Provider  acetaminophen (TYLENOL) 325 MG tablet Take 2 tablets (650 mg total) by mouth every 4 (four) hours as needed for headache or mild pain. 09/29/15  Yes Luke K Kilroy, PA-C  amLODipine (NORVASC) 5 MG tablet Take 5 mg by mouth daily.   Yes Historical Provider, MD  aspirin EC 81 MG tablet Take 81 mg by mouth daily.   Yes Historical Provider, MD  atorvastatin (LIPITOR) 80 MG tablet Take 80 mg by mouth daily.   Yes Historical Provider, MD  carvedilol (COREG) 3.125 MG tablet Take 1 tablet (3.125 mg total) by mouth 2 (two) times daily with a meal. 09/29/15  Yes Eda PaschalLuke K Kilroy, PA-C  isosorbide mononitrate (IMDUR) 30 MG 24 hr tablet Take 30 mg by mouth daily.   Yes Historical Provider, MD  lisinopril (PRINIVIL,ZESTRIL) 2.5 MG tablet Take 1 tablet (2.5 mg total) by mouth daily. 09/29/15  Yes Luke K Kilroy, PA-C  metFORMIN (GLUCOPHAGE) 500 MG tablet Take 500 mg by mouth 2 (two) times daily with a meal.   Yes Historical Provider, MD  nicotine (NICODERM CQ - DOSED IN MG/24 HOURS) 14 mg/24hr patch Place 14 mg onto the skin daily.   Yes Historical Provider, MD  nitroGLYCERIN (NITROSTAT) 0.4 MG SL tablet Place 0.4 mg under the tongue every 5 (five) minutes as needed for chest pain.   Yes Historical Provider, MD  ticagrelor (BRILINTA) 90 MG TABS tablet Take 1 tablet (90 mg total) by mouth 2 (two) times daily. 09/29/15  Yes Abelino DerrickLuke K Kilroy, PA-C    Review of Systems    As above, he has been having left calf claudication with ambulation.  He has had occasional fleeting chest discomfort without palpitations, dyspnea, PND, orthopnea, dizziness, syncope, edema, or early satiety.  All other systems reviewed and are otherwise negative except as noted above.  Physical Exam    VS:  BP 120/72 mmHg  Pulse 72  Ht 5\' 11"  (1.803 m)  Wt 202 lb 12.8 oz (91.989 kg)  BMI 28.30 kg/m2 , BMI Body mass index is 28.3 kg/(m^2). GEN: Well nourished, well developed, in no acute distress. HEENT: normal. Neck: Supple, no  JVD, carotid bruits, or masses. Cardiac: RRR, no murmurs, rubs, or gallops. No clubbing, cyanosis, edema.  Radials/DP/PT 2+ and equal bilaterally.  Respiratory:  Respirations regular and unlabored, clear to auscultation bilaterally. GI: Soft, nontender, nondistended, BS + x 4. MS: no deformity or atrophy. Skin: warm and dry, no rash. Neuro:  Strength and sensation are intact. Psych: Normal affect.  Accessory Clinical Findings    ECG - Regular sinus rhythm, 72, early polarization, no acute ST or T changes.  Assessment & Plan    1.  ST segment elevation myocardial infarction, subsequent episode of care/coronary artery disease/coronary vasospasm/status post cardiac arrest: Patient was recently admitted with chest pain and suffered a VF arrest requiring intubation and defibrillation. Catheterization showed 70% stenosis in the proximal LAD and this was successfully stented. He had previously known vasospasm in that area [facility]. His EF recovered some post  MI and was 45% prior to discharge. He has been doing reasonably well since discharge. He notes occasional fleeting chest discomfort but no significant recurrence of chest discomfort like when he was having prior to admission. He has been compliant with his medications also quit tobacco, marijuana, and cocaine. He remains on aspirin, Brilinta, statin, beta blocker, lisinopril, nitrate, and calcium channel blocker therapy. 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  2. Hyperlipidemia: He is now on high potency statin therapy following recent acute coronary syndrome. I will arrange for follow-up lipids and LFTs in 6 weeks.  3. Ischemic cardiomyopathy: EF 45% on most recent echo following VF arrest. He is euvolemic on exam and remains on beta blocker and ACE inhibitor therapy.  We discussed the importance of daily weights, sodium  restriction, medication compliance, and symptom reporting and he verbalizes understanding.   4. Polysubstance abuse: Complete cessation advised. He is no longer smoking tobacco, marijuana, or using cocaine. I encouraged him to remain off of these drugs.  5. Left calf claudication: He has been experiencing discomfort and cramping in his left calf when he believes. He has not had any change in color, sensation, temperature, or hair distribution. He has a good pulse on that side. Given his smoking and now CAD history, I will arrange for ABIs.  6. Type II DM:  A1c 6.5.  He is on metformin and has primary care f/u.  7.  Disposition: Follow-up lipids and LFTs in 6 weeks. Follow-up ABIs. Follow-up with Dr. Allyson Sabal in 2 months.  Nicolasa Ducking, NP 10/16/2015, 4:15 PM

## 2015-10-29 ENCOUNTER — Inpatient Hospital Stay (HOSPITAL_COMMUNITY): Admission: RE | Admit: 2015-10-29 | Payer: Self-pay | Source: Ambulatory Visit

## 2015-11-18 ENCOUNTER — Inpatient Hospital Stay (INDEPENDENT_AMBULATORY_CARE_PROVIDER_SITE_OTHER): Admission: RE | Admit: 2015-11-18 | Payer: Self-pay | Source: Ambulatory Visit

## 2015-11-18 ENCOUNTER — Other Ambulatory Visit: Payer: Self-pay | Admitting: Cardiovascular Disease

## 2015-11-18 DIAGNOSIS — I739 Peripheral vascular disease, unspecified: Secondary | ICD-10-CM

## 2015-11-18 DIAGNOSIS — R0989 Other specified symptoms and signs involving the circulatory and respiratory systems: Secondary | ICD-10-CM

## 2015-12-04 ENCOUNTER — Ambulatory Visit: Payer: Self-pay | Admitting: Cardiovascular Disease

## 2015-12-05 ENCOUNTER — Encounter: Payer: Self-pay | Admitting: *Deleted

## 2017-05-10 IMAGING — CR DG CHEST 1V PORT
1 series · 1 of 1 positions shown · non-contrast
Comparison: 09/26/2015

CLINICAL DATA: Acute respiratory failure

EXAM:
PORTABLE CHEST 1 VIEW

[AP]
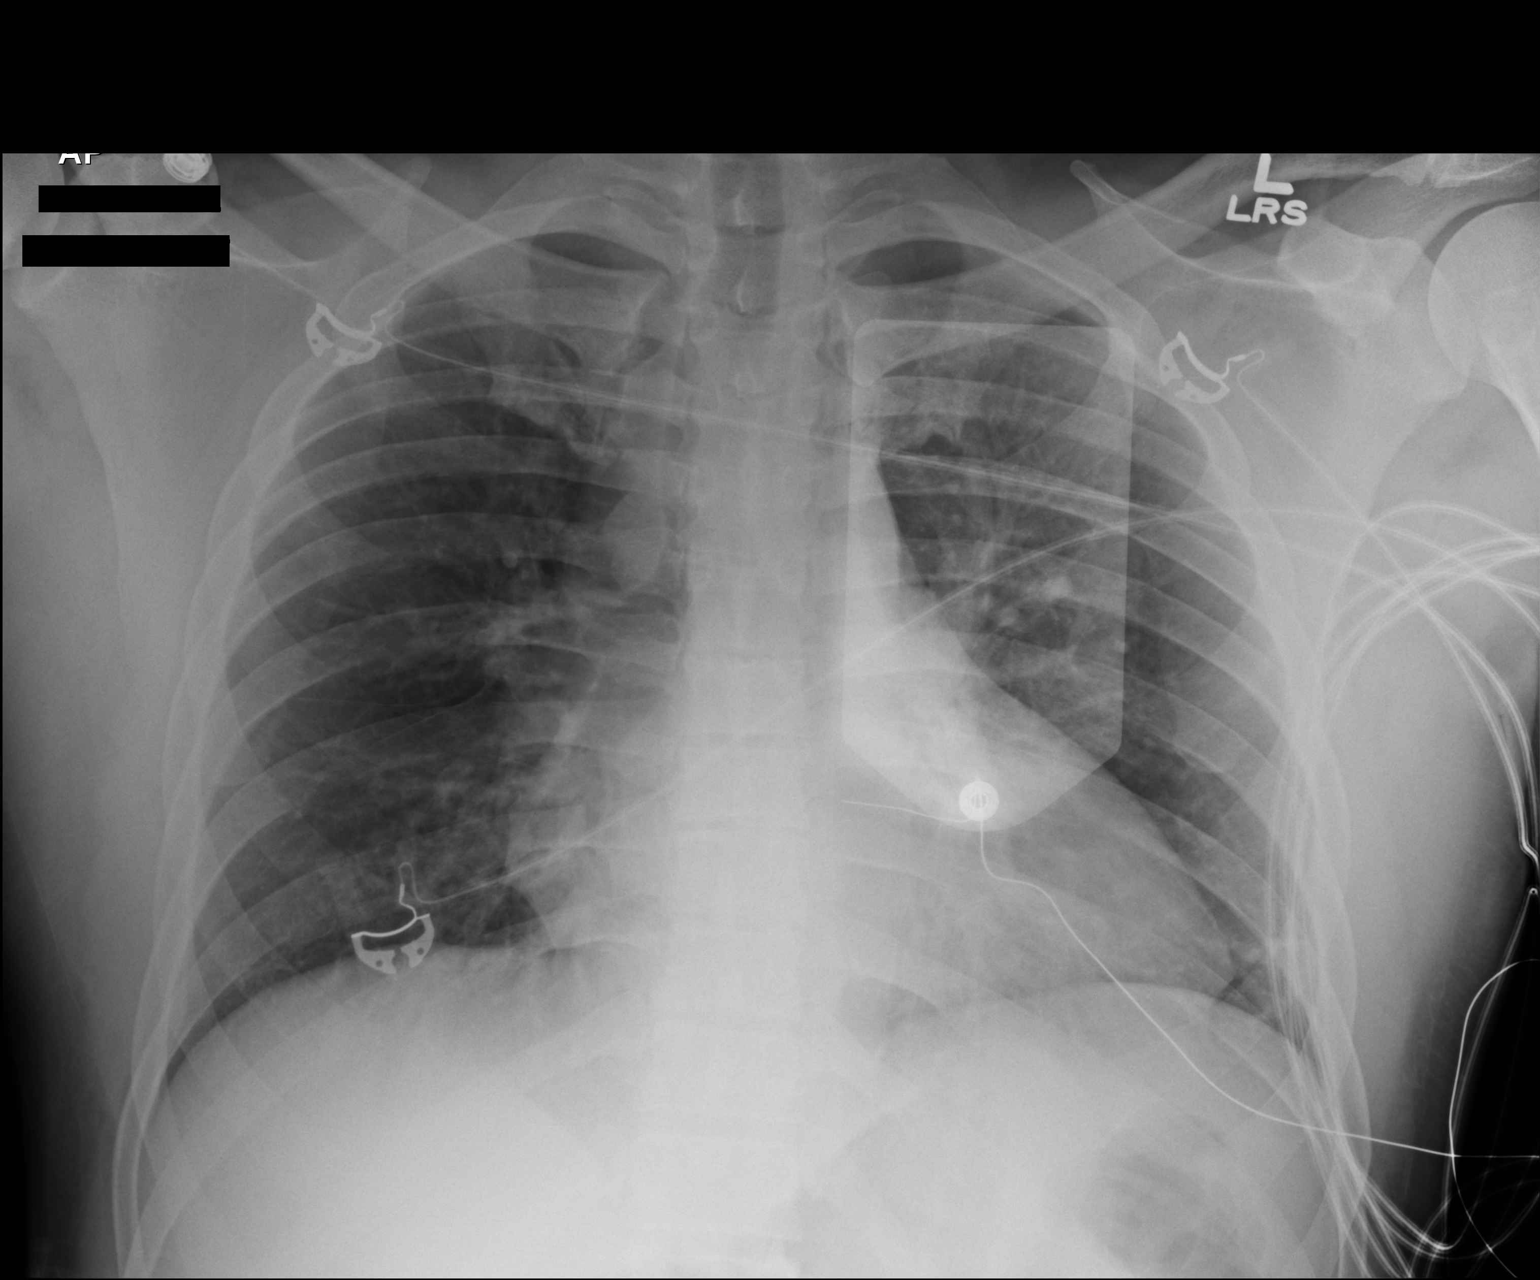

[1 of 1 positions shown; findings below may reference images not displayed]

FINDINGS: Mild cardiomegaly. No confluent airspace opacities, effusions or
edema. No acute bony abnormality.
IMPRESSION: Borderline cardiomegaly.

## 2024-02-19 LAB — AMB RESULTS CONSOLE CBG: Glucose: 192

## 2024-03-07 NOTE — Progress Notes (Signed)
 SDOH Reviewed

## 2024-04-18 NOTE — Progress Notes (Unsigned)
 The patient attended a screening event on 03/07/2024 where his BP screening results was 148/98, non-fasting blood glucose was 192. At the event the patient noted he has no insurance and does not smoke. Patient did not have any SDOH insecurities. Pt did not list pcp. Per chart review pt pcp is not visible in [facility] and there are no past or future visits within the past 12 months for pt. Chart review indicates pt is being seen by a cardiologist at [facility]. On 03/19/2024 pt was seen by Cena Forehand, MD for chest pain BP was 145/99 on 04/18/2024. According to chart pt is instructed to increase carvedilol  to 12.5 mg BID, obtain labs and bring all medications to the next appt for review. Chart review also indicates a future appt with cardiologist on 07/21/2024.   Post event initial f/u [facility] called pt pcp office on 04/18/2024 to confirm that pt is established with Dr. Kaitlyn Kirby Lytle, [facility]. The offfice confirmed that pt was last seen by pcp on 03/21/2024 and has a future appt with pcp on 05/09/2024. The receptionist also stated that pt insurance coverage is [employer]. No additional Health equity team support indicated at this time.
